# Patient Record
Sex: Male | Born: 1965 | Race: Black or African American | Hispanic: No | Marital: Single | Smoking: Never smoker
Health system: Southern US, Community
[De-identification: ages and names within clinical notes are randomized; demographics above are authoritative.]

## PROBLEM LIST (undated history)

## (undated) DIAGNOSIS — I10 Essential (primary) hypertension: Secondary | ICD-10-CM

## (undated) DIAGNOSIS — N289 Disorder of kidney and ureter, unspecified: Secondary | ICD-10-CM

## (undated) HISTORY — PX: NO PAST SURGERIES: SHX2092

---

## 2001-07-30 ENCOUNTER — Emergency Department (HOSPITAL_COMMUNITY): Admission: EM | Admit: 2001-07-30 | Discharge: 2001-07-30 | Payer: Self-pay | Admitting: Emergency Medicine

## 2009-09-10 ENCOUNTER — Emergency Department (HOSPITAL_COMMUNITY): Admission: EM | Admit: 2009-09-10 | Discharge: 2009-09-10 | Payer: Self-pay | Admitting: Emergency Medicine

## 2010-12-19 LAB — DIFFERENTIAL
Basophils Relative: 1 % (ref 0–1)
Eosinophils Absolute: 0.1 10*3/uL (ref 0.0–0.7)
Eosinophils Relative: 2 % (ref 0–5)
Lymphs Abs: 1.9 10*3/uL (ref 0.7–4.0)
Monocytes Relative: 13 % — ABNORMAL HIGH (ref 3–12)

## 2010-12-19 LAB — CBC
MCHC: 34 g/dL (ref 30.0–36.0)
RBC: 4.96 MIL/uL (ref 4.22–5.81)
WBC: 4.1 10*3/uL (ref 4.0–10.5)

## 2010-12-19 LAB — COMPREHENSIVE METABOLIC PANEL
ALT: 26 U/L (ref 0–53)
Alkaline Phosphatase: 52 U/L (ref 39–117)
Chloride: 105 mEq/L (ref 96–112)
Glucose, Bld: 115 mg/dL — ABNORMAL HIGH (ref 70–99)
Potassium: 3.5 mEq/L (ref 3.5–5.1)
Sodium: 138 mEq/L (ref 135–145)
Total Bilirubin: 0.9 mg/dL (ref 0.3–1.2)
Total Protein: 6.8 g/dL (ref 6.0–8.3)

## 2010-12-19 LAB — POCT CARDIAC MARKERS

## 2010-12-19 LAB — MAGNESIUM: Magnesium: 2 mg/dL (ref 1.5–2.5)

## 2012-06-13 ENCOUNTER — Ambulatory Visit: Payer: Self-pay

## 2014-05-06 ENCOUNTER — Ambulatory Visit: Payer: Self-pay | Admitting: Nurse Practitioner

## 2015-04-06 ENCOUNTER — Encounter: Payer: Self-pay | Admitting: Urgent Care

## 2015-04-06 ENCOUNTER — Emergency Department: Payer: BLUE CROSS/BLUE SHIELD

## 2015-04-06 ENCOUNTER — Emergency Department
Admission: EM | Admit: 2015-04-06 | Discharge: 2015-04-06 | Disposition: A | Discharge status: Home / Self Care | Payer: BLUE CROSS/BLUE SHIELD | Attending: Emergency Medicine | Admitting: Emergency Medicine

## 2015-04-06 DIAGNOSIS — I10 Essential (primary) hypertension: Secondary | ICD-10-CM | POA: Diagnosis not present

## 2015-04-06 DIAGNOSIS — Y998 Other external cause status: Secondary | ICD-10-CM | POA: Diagnosis not present

## 2015-04-06 DIAGNOSIS — S39012A Strain of muscle, fascia and tendon of lower back, initial encounter: Secondary | ICD-10-CM | POA: Diagnosis not present

## 2015-04-06 DIAGNOSIS — X58XXXA Exposure to other specified factors, initial encounter: Secondary | ICD-10-CM | POA: Insufficient documentation

## 2015-04-06 DIAGNOSIS — Y9389 Activity, other specified: Secondary | ICD-10-CM | POA: Diagnosis not present

## 2015-04-06 DIAGNOSIS — Y9289 Other specified places as the place of occurrence of the external cause: Secondary | ICD-10-CM | POA: Insufficient documentation

## 2015-04-06 DIAGNOSIS — S3992XA Unspecified injury of lower back, initial encounter: Secondary | ICD-10-CM | POA: Diagnosis present

## 2015-04-06 HISTORY — DX: Essential (primary) hypertension: I10

## 2015-04-06 LAB — URINALYSIS COMPLETE WITH MICROSCOPIC (ARMC ONLY)
Bacteria, UA: NONE SEEN
Bilirubin Urine: NEGATIVE
GLUCOSE, UA: NEGATIVE mg/dL
HGB URINE DIPSTICK: NEGATIVE
KETONES UR: NEGATIVE mg/dL
Leukocytes, UA: NEGATIVE
NITRITE: NEGATIVE
PROTEIN: NEGATIVE mg/dL
RBC / HPF: NONE SEEN RBC/hpf (ref 0–5)
Specific Gravity, Urine: 1.024 (ref 1.005–1.030)
Squamous Epithelial / LPF: NONE SEEN
pH: 5 (ref 5.0–8.0)

## 2015-04-06 MED ORDER — KETOROLAC TROMETHAMINE 30 MG/ML IJ SOLN
30.0000 mg | Freq: Once | INTRAMUSCULAR | Status: AC
  Administered 2015-04-06: 30 mg via INTRAMUSCULAR
  Filled 2015-04-06: qty 1

## 2015-04-06 MED ORDER — CYCLOBENZAPRINE HCL 10 MG PO TABS: 10.0000 mg | ORAL_TABLET | Freq: Three times a day (TID) | ORAL | Status: AC | PRN

## 2015-04-06 MED ORDER — IBUPROFEN 800 MG PO TABS: 800.0000 mg | ORAL_TABLET | Freq: Three times a day (TID) | ORAL | Status: DC | PRN

## 2015-04-06 NOTE — ED Provider Notes (Signed)
CSN: [id number]     Arrival date & time 04/06/15  9371 History   None    Chief Complaint  Patient presents with  . Back Pain    HPI Comments: 49 year old male presents today complaining of left-sided back pain for the past 4-5 days. He reports that he does a lot of heavy lifting at work but he does not remember a specific injury. He denies pain down his legs, tingling or numbness down his legs, dysuria, hematuria, nausea and vomiting. He has a remote history of kidney stones. He has taken Motrin with moderate relief.  Patient is a 49 y.o. male presenting with back pain. The history is provided by the patient.  Back Pain Location:  Lumbar spine Quality:  Aching Radiates to:  Does not radiate Pain severity:  Moderate Pain is:  Same all the time Onset quality:  Gradual Duration:  5 days Timing:  Constant Progression:  Unchanged Context: lifting heavy objects and occupational injury   Context: not recent illness and not recent injury   Relieved by:  NSAIDs Worsened by:  Movement and ambulation Associated symptoms: no bladder incontinence, no bowel incontinence, no dysuria, no leg pain, no numbness, no paresthesias, no perianal numbness, no tingling and no weakness   Risk factors: obesity     Past Medical History  Diagnosis Date  . Hypertension    History reviewed. No pertinent past surgical history. No family history on file. History  Substance Use Topics  . Smoking status: Never Smoker   . Smokeless tobacco: Not on file  . Alcohol Use: No    Review of Systems  Gastrointestinal: Negative for bowel incontinence.  Genitourinary: Negative for bladder incontinence, dysuria, hematuria and flank pain.  Musculoskeletal: Positive for myalgias, back pain and arthralgias.  Neurological: Negative for tingling, weakness, numbness and paresthesias.  All other systems reviewed and are negative.     Allergies  Review of patient's allergies indicates no known allergies.  Home  Medications   Prior to Admission medications   Medication Sig Start Date End Date Taking? Authorizing Provider  cyclobenzaprine (FLEXERIL) 10 MG tablet Take 1 tablet (10 mg total) by mouth every 8 (eight) hours as needed for muscle spasms. 04/06/15 04/05/16  Luvenia Redden, PA-C  ibuprofen (ADVIL,MOTRIN) 800 MG tablet Take 1 tablet (800 mg total) by mouth every 8 (eight) hours as needed. 04/06/15   Wilber Oliphant V, PA-C   BP 141/67 mmHg  Pulse 54  Temp(Src) 98.6 F (37 C) (Oral)  Resp 18  Ht  (1.753 m)  Wt 240 lb (108.863 kg)  BMI 35.43 kg/m2  SpO2 97% Physical Exam  Constitutional: He is oriented to person, place, and time. Vital signs are normal. He appears well-developed and well-nourished. He is active.  Non-toxic appearance. He does not have a sickly appearance. He does not appear ill.  Obese  HENT:  Head: Normocephalic and atraumatic.  Cardiovascular: Normal rate, regular rhythm, normal heart sounds and intact distal pulses.  Exam reveals no gallop and no friction rub.   No murmur heard. Pulses:      Posterior tibial pulses are 2+ on the right side, and 2+ on the left side.  Pulmonary/Chest: Effort normal and breath sounds normal. No respiratory distress. He has no wheezes. He has no rales.  Abdominal: Soft. Bowel sounds are normal. He exhibits no distension. There is no tenderness. There is no rebound and no guarding.  No CVA tenderness  Musculoskeletal: Normal range of motion.  Right hip: Normal. He exhibits normal range of motion.       Left hip: Normal. He exhibits normal range of motion.       Lumbar back: Normal. He exhibits normal range of motion and no bony tenderness.  No tenderness to palpation over lumbar spine Left lumbar paraspinal muscles tender to palpation without spasm  Neurological: He is alert and oriented to person, place, and time. He has normal strength. No sensory deficit. Gait normal.  Equal strength bilateral lower extremities 5 out of 5 Negative  straight leg raise bilaterally Negative Faber bilaterally  Skin: Skin is warm and dry.  Psychiatric: He has a normal mood and affect. His behavior is normal. Judgment and thought content normal.  Nursing note and vitals reviewed.   ED Course  Procedures (including critical care time) Labs Review Labs Reviewed  URINALYSIS COMPLETEWITH MICROSCOPIC ([facility] ONLY) - Abnormal; Notable for the following:    Color, Urine YELLOW (*)    APPearance CLEAR (*)    All other components within normal limits    Imaging Review No results found.   EKG Interpretation None     I independently reviewed lumbar spine XRAY and there is no evidence of fracture. There is minimal degenerative change at L5/S1 MDM  I reviewed his urine and there is no hematuria. I do not suspect kidney stone based on patient's presentation. Toradol 30 g IM in ER check lumbar spine x-ray Exam consistent with muscular pain- no indication for further work up in ER. Flexeril/Motrin, follow up with PCP Final diagnoses:  Strain of lumbar paraspinal muscle, initial encounter      Luvenia ReddenEmma Weavil V, PA-C 04/06/15 [id number]  Sharyn CreamerMark Quale, MD 04/06/15 1536

## 2015-04-06 NOTE — ED Notes (Signed)
Patient presents with c/o LEFT lower back pain with some (+) radiation into LEFT flank area x 4 days. Denies N/V/D/F and urinary symptoms. No known injuries reported.

## 2015-04-06 NOTE — ED Notes (Signed)
Increasing left flank / back pain, x4-5 day no injury, denies any urinary symptoms, no discoloration of urine. Hx of kidney stones "years ago" with same symptoms

## 2015-04-06 NOTE — Discharge Instructions (Signed)

## 2015-10-06 ENCOUNTER — Emergency Department: Payer: BLUE CROSS/BLUE SHIELD

## 2015-10-06 ENCOUNTER — Emergency Department
Admission: EM | Admit: 2015-10-06 | Discharge: 2015-10-07 | Disposition: A | Discharge status: Home / Self Care | Payer: BLUE CROSS/BLUE SHIELD | Attending: Emergency Medicine | Admitting: Emergency Medicine

## 2015-10-06 ENCOUNTER — Encounter: Payer: Self-pay | Admitting: *Deleted

## 2015-10-06 DIAGNOSIS — R200 Anesthesia of skin: Secondary | ICD-10-CM | POA: Diagnosis not present

## 2015-10-06 DIAGNOSIS — R519 Headache, unspecified: Secondary | ICD-10-CM

## 2015-10-06 DIAGNOSIS — R51 Headache: Secondary | ICD-10-CM

## 2015-10-06 DIAGNOSIS — I10 Essential (primary) hypertension: Secondary | ICD-10-CM | POA: Diagnosis not present

## 2015-10-06 LAB — BASIC METABOLIC PANEL
Anion gap: 4 — ABNORMAL LOW (ref 5–15)
BUN: 20 mg/dL (ref 6–20)
CO2: 28 mmol/L (ref 22–32)
CREATININE: 1.39 mg/dL — AB (ref 0.61–1.24)
Calcium: 9.1 mg/dL (ref 8.9–10.3)
Chloride: 104 mmol/L (ref 101–111)
GFR, EST NON AFRICAN AMERICAN: 58 mL/min — AB (ref >60)
Glucose, Bld: 103 mg/dL — ABNORMAL HIGH (ref 65–99)
POTASSIUM: 3.6 mmol/L (ref 3.5–5.1)
SODIUM: 136 mmol/L (ref 135–145)

## 2015-10-06 LAB — CBC
HEMATOCRIT: 41.8 % (ref 40.0–52.0)
HEMOGLOBIN: 13.8 g/dL (ref 13.0–18.0)
MCH: 25.7 pg — AB (ref 26.0–34.0)
MCHC: 33 g/dL (ref 32.0–36.0)
MCV: 77.9 fL — AB (ref 80.0–100.0)
Platelets: 184 10*3/uL (ref 150–440)
RBC: 5.37 MIL/uL (ref 4.40–5.90)
RDW: 13.9 % (ref 11.5–14.5)
WBC: 4.8 10*3/uL (ref 3.8–10.6)

## 2015-10-06 NOTE — ED Notes (Addendum)
Pt states severe headache that began about 4pm, states numbness to his right side of his head, states hx of HTN but not taking his BP meds regular, denies any dizziness or blurred vision, states his friend check his BP and it was 189/120, denies any chest pain or SOB

## 2015-10-06 NOTE — ED Provider Notes (Signed)
[facility] Emergency Department Provider Note  ____________________________________________  Time seen: 11:40 PM  I have reviewed the triage vital signs and the nursing notes.   HISTORY  Chief Complaint Headache and Hypertension      HPI Jeffery Golden is a 50 y.o. male resents with acute onset of headache at 4 PM accompanied by numbness in the right side of his forehead. Patient had his blood pressure checked by his friend which was 189/120. Patient denies any chest pain no shortness of breath. Patient states headache is since resolved. Of note patient admits to being noncompliant with his lisinopril/HCTZ but did take one today.     Past Medical History  Diagnosis Date  . Hypertension     There are no active problems to display for this patient.   History reviewed. No pertinent past surgical history.  Current Outpatient Rx  Name  Route  Sig  Dispense  Refill  . cyclobenzaprine (FLEXERIL) 10 MG tablet   Oral   Take 1 tablet (10 mg total) by mouth every 8 (eight) hours as needed for muscle spasms.   30 tablet   0   . ibuprofen (ADVIL,MOTRIN) 800 MG tablet   Oral   Take 1 tablet (800 mg total) by mouth every 8 (eight) hours as needed.   30 tablet   0     Allergies Review of patient's allergies indicates no known allergies.  History reviewed. No pertinent family history.  Social History Social History  Substance Use Topics  . Smoking status: Never Smoker   . Smokeless tobacco: None  . Alcohol Use: No    Review of Systems  Constitutional: Negative for fever. Eyes: Negative for visual changes. ENT: Negative for sore throat. Cardiovascular: Negative for chest pain. Respiratory: Negative for shortness of breath. Gastrointestinal: Negative for abdominal pain, vomiting and diarrhea. Genitourinary: Negative for dysuria. Musculoskeletal: Negative for back pain. Skin: Negative for rash. Neurological: Positive for headache and  numbness  10-point ROS otherwise negative.  ____________________________________________   PHYSICAL EXAM:  VITAL SIGNS: ED Triage Vitals  Enc Vitals Group     BP 10/06/15 1834 154/86 mmHg     Pulse Rate 10/06/15 1834 56     Resp 10/06/15 1834 18     Temp 10/06/15 1834 98.3 F (36.8 C)     Temp Source 10/06/15 1834 Oral     SpO2 10/06/15 1834 100 %     Weight 10/06/15 1834 235 lb (106.595 kg)     Height 10/06/15 1834  (1.753 m)     Head Cir --      Peak Flow --      Pain Score 10/06/15 1835 5     Pain Loc --      Pain Edu? --      Excl. in GC? --      Constitutional: Alert and oriented. Well appearing and in no distress. Eyes: Conjunctivae are normal. PERRL. Normal extraocular movements. ENT   Head: Normocephalic and atraumatic.   Nose: No congestion/rhinnorhea.   Mouth/Throat: Mucous membranes are moist.   Neck: No stridor. Hematological/Lymphatic/Immunilogical: No cervical lymphadenopathy. Cardiovascular: Normal rate, regular rhythm. Normal and symmetric distal pulses are present in all extremities. No murmurs, rubs, or gallops. Respiratory: Normal respiratory effort without tachypnea nor retractions. Breath sounds are clear and equal bilaterally. No wheezes/rales/rhonchi. Gastrointestinal: Soft and nontender. No distention. There is no CVA tenderness. Genitourinary: deferred Musculoskeletal: Nontender with normal range of motion in all extremities. No joint effusions.  No  lower extremity tenderness nor edema. Neurologic:  Normal speech and language. No gross focal neurologic deficits are appreciated. Speech is normal.  Skin:  Skin is warm, dry and intact. No rash noted. Psychiatric: Mood and affect are normal. Speech and behavior are normal. Patient exhibits appropriate insight and judgment.  ____________________________________________    LABS (pertinent positives/negatives)  Labs Reviewed  BASIC METABOLIC PANEL - Abnormal; Notable for the  following:    Glucose, Bld 103 (*)    Creatinine, Ser 1.39 (*)    GFR calc non Af Amer 58 (*)    Anion gap 4 (*)    All other components within normal limits  CBC - Abnormal; Notable for the following:    MCV 77.9 (*)    MCH 25.7 (*)    All other components within normal limits         RADIOLOGY  CT Head Wo Contrast (Final result) Result time: 10/07/15 00:21:06   Final result by Rad Results In Interface (10/07/15 00:21:06)   Narrative:   CLINICAL DATA: 50 year old male with hypertension and headache  EXAM: CT HEAD WITHOUT CONTRAST  TECHNIQUE: Contiguous axial images were obtained from the base of the skull through the vertex without intravenous contrast.  COMPARISON: None  FINDINGS: The ventricles and sulci are appropriate in size for patient's age. Minimal periventricular and deep white matter hypodensities represent chronic microvascular ischemic changes. There is no intracranial hemorrhage. No mass effect or midline shift identified.  The visualized paranasal sinuses and mastoid air cells are well aerated. The calvarium is intact.  IMPRESSION: No acute intracranial pathology.   Electronically Signed By: Elgie Collard M.D. On: 10/07/2015 00:21         INITIAL IMPRESSION / ASSESSMENT AND PLAN / ED COURSE  Pertinent labs & imaging results that were available during my care of the patient were reviewed by me and considered in my medical decision making (see chart for details).   Patient's blood pressure 145/81. I discussed the patient at length the importance of taking his blood pressure medication as prescribed with potential adverse sequelae of not doing so. ____________________________________________   FINAL CLINICAL IMPRESSION(S) / ED DIAGNOSES  Final diagnoses:  Essential hypertension  Acute nonintractable headache, unspecified headache type      Darci Current, MD 10/07/15 [phone]

## 2015-10-07 NOTE — Discharge Instructions (Signed)
Hypertension Hypertension, commonly called high blood pressure, is when the force of blood pumping through your arteries is too strong. Your arteries are the blood vessels that carry blood from your heart throughout your body. A blood pressure reading consists of a higher number over a lower number, such as 110/72. The higher number (systolic) is the pressure inside your arteries when your heart pumps. The lower number (diastolic) is the pressure inside your arteries when your heart relaxes. Ideally you want your blood pressure below 120/80. Hypertension forces your heart to work harder to pump blood. Your arteries may become narrow or stiff. Having untreated or uncontrolled hypertension can cause heart attack, stroke, kidney disease, and other problems. RISK FACTORS Some risk factors for high blood pressure are controllable. Others are not.  Risk factors you cannot control include:   Race. You may be at higher risk if you are African American.  Age. Risk increases with age.  Gender. Men are at higher risk than women before age 45 years. After age 65, women are at higher risk than men. Risk factors you can control include:  Not getting enough exercise or physical activity.  Being overweight.  Getting too much fat, sugar, calories, or salt in your diet.  Drinking too much alcohol. SIGNS AND SYMPTOMS Hypertension does not usually cause signs or symptoms. Extremely high blood pressure (hypertensive crisis) may cause headache, anxiety, shortness of breath, and nosebleed. DIAGNOSIS To check if you have hypertension, your health care provider will measure your blood pressure while you are seated, with your arm held at the level of your heart. It should be measured at least twice using the same arm. Certain conditions can cause a difference in blood pressure between your right and left arms. A blood pressure reading that is higher than normal on one occasion does not mean that you need treatment. If  it is not clear whether you have high blood pressure, you may be asked to return on a different day to have your blood pressure checked again. Or, you may be asked to monitor your blood pressure at home for 1 or more weeks. TREATMENT Treating high blood pressure includes making lifestyle changes and possibly taking medicine. Living a healthy lifestyle can help lower high blood pressure. You may need to change some of your habits. Lifestyle changes may include:  Following the DASH diet. This diet is high in fruits, vegetables, and whole grains. It is low in salt, red meat, and added sugars.  Keep your sodium intake below 2,300 mg per day.  Getting at least 30-45 minutes of aerobic exercise at least 4 times per week.  Losing weight if necessary.  Not smoking.  Limiting alcoholic beverages.  Learning ways to reduce stress. Your health care provider may prescribe medicine if lifestyle changes are not enough to get your blood pressure under control, and if one of the following is true:  You are 18-59 years of age and your systolic blood pressure is above 140.  You are 60 years of age or older, and your systolic blood pressure is above 150.  Your diastolic blood pressure is above 90.  You have diabetes, and your systolic blood pressure is over 140 or your diastolic blood pressure is over 90.  You have kidney disease and your blood pressure is above 140/90.  You have heart disease and your blood pressure is above 140/90. Your personal target blood pressure may vary depending on your medical conditions, your age, and other factors. HOME CARE INSTRUCTIONS    Have your blood pressure rechecked as directed by your health care provider.   Take medicines only as directed by your health care provider. Follow the directions carefully. Blood pressure medicines must be taken as prescribed. The medicine does not work as well when you skip doses. Skipping doses also puts you at risk for  problems.  Do not smoke.   Monitor your blood pressure at home as directed by your health care provider. SEEK MEDICAL CARE IF:   You think you are having a reaction to medicines taken.  You have recurrent headaches or feel dizzy.  You have swelling in your ankles.  You have trouble with your vision. SEEK IMMEDIATE MEDICAL CARE IF:  You develop a severe headache or confusion.  You have unusual weakness, numbness, or feel faint.  You have severe chest or abdominal pain.  You vomit repeatedly.  You have trouble breathing. MAKE SURE YOU:   Understand these instructions.  Will watch your condition.  Will get help right away if you are not doing well or get worse.   This information is not intended to replace advice given to you by your health care provider. Make sure you discuss any questions you have with your health care provider.   Document Released: 09/04/2005 Document Revised: 01/19/2015 Document Reviewed: 06/27/2013 Elsevier Interactive Patient Education 2016 [employer]  

## 2016-04-06 ENCOUNTER — Emergency Department (HOSPITAL_COMMUNITY)
Admission: EM | Admit: 2016-04-06 | Discharge: 2016-04-06 | Disposition: A | Discharge status: Home / Self Care | Payer: BLUE CROSS/BLUE SHIELD | Attending: Emergency Medicine | Admitting: Emergency Medicine

## 2016-04-06 ENCOUNTER — Encounter (HOSPITAL_COMMUNITY): Payer: Self-pay | Admitting: Emergency Medicine

## 2016-04-06 DIAGNOSIS — Y929 Unspecified place or not applicable: Secondary | ICD-10-CM | POA: Insufficient documentation

## 2016-04-06 DIAGNOSIS — S39012A Strain of muscle, fascia and tendon of lower back, initial encounter: Secondary | ICD-10-CM | POA: Insufficient documentation

## 2016-04-06 DIAGNOSIS — Y9389 Activity, other specified: Secondary | ICD-10-CM | POA: Insufficient documentation

## 2016-04-06 DIAGNOSIS — S3992XA Unspecified injury of lower back, initial encounter: Secondary | ICD-10-CM | POA: Diagnosis present

## 2016-04-06 DIAGNOSIS — Y990 Civilian activity done for income or pay: Secondary | ICD-10-CM | POA: Diagnosis not present

## 2016-04-06 DIAGNOSIS — I10 Essential (primary) hypertension: Secondary | ICD-10-CM | POA: Insufficient documentation

## 2016-04-06 DIAGNOSIS — X500XXA Overexertion from strenuous movement or load, initial encounter: Secondary | ICD-10-CM | POA: Diagnosis not present

## 2016-04-06 MED ORDER — HYDROCODONE-ACETAMINOPHEN 5-325 MG PO TABS: ORAL_TABLET | ORAL | Status: DC

## 2016-04-06 MED ORDER — IBUPROFEN 800 MG PO TABS
800.0000 mg | ORAL_TABLET | Freq: Three times a day (TID) | ORAL | Status: DC
Start: 2016-04-06 — End: 2017-11-07

## 2016-04-06 MED ORDER — CYCLOBENZAPRINE HCL 10 MG PO TABS: 10.0000 mg | ORAL_TABLET | Freq: Three times a day (TID) | ORAL | Status: DC | PRN

## 2016-04-06 NOTE — ED Notes (Signed)
Pt alert & oriented x4, stable gait. Patient given discharge instructions, paperwork & prescription(s). Patient informed not to drive, operate any equipment & handel any important documents 4 hours after taking pain medication. Patient  instructed to stop at the registration desk to finish any additional paperwork. Patient  verbalized understanding. Pt left department w/ no further questions. 

## 2016-04-06 NOTE — ED Notes (Signed)
Pt states he began having back pain yesterday.  States he does a lot of heavy lifting at work, but denies injury.

## 2016-04-06 NOTE — ED Provider Notes (Signed)
CSN: [id number]     Arrival date & time 04/06/16  1821 History   First MD Initiated Contact with Patient 04/06/16 1828     Chief Complaint  Patient presents with  . Back Pain     (Consider location/radiation/quality/duration/timing/severity/associated sxs/prior Treatment) HPI   Jeffery Golden is a 50 y.o. male who presents to the Emergency Department complaining of gradual onset of left lower back pain that began yesterday after heavy lifting at his job.  He took ibuprofen this morning with minimal relief.  Pain is worse with standing and walking.  He denies fall, abd pain, numbness, weakness or pain to the LE's, urine or bowel changes.   Past Medical History  Diagnosis Date  . Hypertension    History reviewed. No pertinent past surgical history. History reviewed. No pertinent family history. Social History  Substance Use Topics  . Smoking status: Never Smoker   . Smokeless tobacco: None  . Alcohol Use: No    Review of Systems  Constitutional: Negative for fever.  Respiratory: Negative for shortness of breath.   Gastrointestinal: Negative for vomiting, abdominal pain and constipation.  Genitourinary: Negative for dysuria, hematuria, flank pain, decreased urine volume and difficulty urinating.  Musculoskeletal: Positive for back pain. Negative for joint swelling.  Skin: Negative for rash.  Neurological: Negative for weakness and numbness.  All other systems reviewed and are negative.     Allergies  Review of patient's allergies indicates no known allergies.  Home Medications   Prior to Admission medications   Medication Sig Start Date End Date Taking? Authorizing Provider  ibuprofen (ADVIL,MOTRIN) 800 MG tablet Take 1 tablet (800 mg total) by mouth every 8 (eight) hours as needed. 04/06/15   Christella Scheuermann, PA-C   BP 136/75 mmHg  Pulse 50  Temp(Src) 98.1 F (36.7 C) (Oral)  Resp 16  Ht  (1.753 m)  Wt 108.41 kg  BMI 35.28 kg/m2  SpO2 100% Physical Exam   Constitutional: He is oriented to person, place, and time. He appears well-developed and well-nourished. No distress.  HENT:  Head: Normocephalic and atraumatic.  Neck: Normal range of motion. Neck supple.  Cardiovascular: Normal rate, regular rhythm, normal heart sounds and intact distal pulses.   No murmur heard. Pulmonary/Chest: Effort normal and breath sounds normal. No respiratory distress.  Abdominal: Soft. He exhibits no distension. There is no tenderness.  Musculoskeletal: He exhibits tenderness. He exhibits no edema.       Lumbar back: He exhibits tenderness and pain. He exhibits normal range of motion, no swelling, no deformity, no laceration and normal pulse.  ttp of the left lumbar paraspinal muscles.  No spinal tenderness.  DP pulses are brisk and symmetrical.  Distal sensation intact.  Pt has 5/5 strength against resistance of bilateral lower extremities.     Neurological: He is alert and oriented to person, place, and time. He has normal strength. No sensory deficit. He exhibits normal muscle tone. Coordination and gait normal.  Reflex Scores:      Patellar reflexes are 2+ on the right side and 2+ on the left side.      Achilles reflexes are 2+ on the right side and 2+ on the left side. Skin: Skin is warm and dry. No rash noted.  Nursing note and vitals reviewed.   ED Course  Procedures (including critical care time) Labs Review Labs Reviewed - No data to display  Imaging Review No results found. I have personally reviewed and evaluated these images and lab  results as part of my medical decision-making.   EKG Interpretation None      MDM   Final diagnoses:  Lumbar strain, initial encounter    Patient well-appearing. Vital signs are stable. Ambulates with a steady gait. Localized tenderness of the left lower back, likely musculoskeletal. No concerning symptoms for emergent neurological process. Patient agrees symptomatic treatment and PMD follow-up if not  improving.  Narcotic database reviewed, no recent Rx's on file since April.   Pauline Ausammy Necole Minassian, PA-C 04/06/16 1907  Samuel JesterKathleen McManus, DO 04/08/16 2150

## 2016-04-06 NOTE — Discharge Instructions (Signed)

## 2016-06-05 ENCOUNTER — Encounter (HOSPITAL_COMMUNITY): Payer: Self-pay | Admitting: Emergency Medicine

## 2016-06-05 ENCOUNTER — Emergency Department (HOSPITAL_COMMUNITY)
Admission: EM | Admit: 2016-06-05 | Discharge: 2016-06-05 | Disposition: A | Discharge status: Home / Self Care | Payer: BLUE CROSS/BLUE SHIELD | Attending: Emergency Medicine | Admitting: Emergency Medicine

## 2016-06-05 DIAGNOSIS — I10 Essential (primary) hypertension: Secondary | ICD-10-CM | POA: Insufficient documentation

## 2016-06-05 DIAGNOSIS — Z79899 Other long term (current) drug therapy: Secondary | ICD-10-CM | POA: Insufficient documentation

## 2016-06-05 DIAGNOSIS — Y999 Unspecified external cause status: Secondary | ICD-10-CM | POA: Insufficient documentation

## 2016-06-05 DIAGNOSIS — W228XXA Striking against or struck by other objects, initial encounter: Secondary | ICD-10-CM | POA: Insufficient documentation

## 2016-06-05 DIAGNOSIS — Y939 Activity, unspecified: Secondary | ICD-10-CM | POA: Insufficient documentation

## 2016-06-05 DIAGNOSIS — S60455A Superficial foreign body of left ring finger, initial encounter: Secondary | ICD-10-CM | POA: Insufficient documentation

## 2016-06-05 DIAGNOSIS — S6992XA Unspecified injury of left wrist, hand and finger(s), initial encounter: Secondary | ICD-10-CM

## 2016-06-05 DIAGNOSIS — Y929 Unspecified place or not applicable: Secondary | ICD-10-CM | POA: Insufficient documentation

## 2016-06-05 DIAGNOSIS — Z23 Encounter for immunization: Secondary | ICD-10-CM | POA: Insufficient documentation

## 2016-06-05 MED ORDER — TETANUS-DIPHTH-ACELL PERTUSSIS 5-2.5-18.5 LF-MCG/0.5 IM SUSP
0.5000 mL | Freq: Once | INTRAMUSCULAR | Status: AC
  Administered 2016-06-05: 0.5 mL via INTRAMUSCULAR
  Filled 2016-06-05: qty 0.5

## 2016-06-05 MED ORDER — LIDOCAINE HCL (PF) 1 % IJ SOLN
5.0000 mL | Freq: Once | INTRAMUSCULAR | Status: AC
  Administered 2016-06-05: 1 mL via INTRADERMAL
  Filled 2016-06-05: qty 5

## 2016-06-05 MED ORDER — CEPHALEXIN 500 MG PO CAPS: 500.0000 mg | ORAL_CAPSULE | Freq: Three times a day (TID) | ORAL | 0 refills | Status: DC

## 2016-06-05 NOTE — ED Provider Notes (Signed)
[facility]Y DEPT Provider Note   CSN: [id number] Arrival date & time: 06/05/16  1659  By signing my name below, I, Soijett Blue, attest that this documentation has been prepared under the direction and in the presence of Pauline Ausammy Citlalli Weikel, PA-C Electronically Signed: Soijett Blue, ED Scribe. 06/05/16. 5:54 PM.   History   Chief Complaint Chief Complaint  Patient presents with  . Finger Injury    HPI Jeffery Golden is a 50 y.o. male with a PMHx of HTN, who presents to the Emergency Department complaining of left ring finger injury occurring 1 hour ago PTA. Pt notes that he was preparing to go fishing when he reached inside a box when a fishing lure attached to his left ring finger. Pt states that he trimmed the lure and a portion of the fishing hook, although he notes that there is still a portion of the fishing hook inside his left ring finger.  He notes that he has tried removing the fishing hook without medications for the relief of her symptoms. He denies color change, rash, swelling, numbness and any other symptoms.    The history is provided by the patient. No language interpreter was used.    Past Medical History:  Diagnosis Date  . Hypertension     There are no active problems to display for this patient.   History reviewed. No pertinent surgical history.     Home Medications    Prior to Admission medications   Medication Sig Start Date End Date Taking? Authorizing Provider  cyclobenzaprine (FLEXERIL) 10 MG tablet Take 1 tablet (10 mg total) by mouth 3 (three) times daily as needed. 04/06/16   Zoii Florer, PA-C  HYDROcodone-acetaminophen (NORCO/VICODIN) 5-325 MG tablet Take one-two tabs po q 4-6 hrs prn pain 04/06/16   Amy Belloso, PA-C  ibuprofen (ADVIL,MOTRIN) 800 MG tablet Take 1 tablet (800 mg total) by mouth 3 (three) times daily. 04/06/16   Shady Bradish, PA-C    Family History No family history on file.  Social History Social History  Substance  Use Topics  . Smoking status: Never Smoker  . Smokeless tobacco: Never Used  . Alcohol use No     Allergies   Review of patient's allergies indicates no known allergies.   Review of Systems Review of Systems  Musculoskeletal: Negative for arthralgias and joint swelling.  Skin: Positive for wound. Negative for color change and rash.       Fish hook in left ring finger     Physical Exam Updated Vital Signs BP 145/74 (BP Location: Left Arm)   Pulse (!) 58   Temp 98.3 F (36.8 C) (Temporal)   Resp 14   Ht 5\' 8"  (1.727 m)   Wt 240 lb (108.9 kg)   SpO2 100%   BMI 36.49 kg/m   Physical Exam  Constitutional: He is oriented to person, place, and time. He appears well-developed and well-nourished. No distress.  HENT:  Head: Normocephalic and atraumatic.  Eyes: EOM are normal.  Neck: Neck supple.  Cardiovascular: Normal rate.   Pulmonary/Chest: Effort normal. No respiratory distress.  Abdominal: He exhibits no distension.  Musculoskeletal: Normal range of motion.  Fish hook into fourth finger without active bleeding or edema.   Neurological: He is alert and oriented to person, place, and time.  Skin: Skin is warm and dry.  Psychiatric: He has a normal mood and affect. His behavior is normal.  Nursing note and vitals reviewed.    ED Treatments / Results  DIAGNOSTIC STUDIES:  Oxygen Saturation is 100% on RA, nl by my interpretation.    COORDINATION OF CARE: 5:36 PM Discussed treatment plan with pt at bedside which includes foreign body removal and update tetanus and pt agreed to plan.   Procedures .Foreign Body Removal Date/Time: 06/05/2016 5:38 PM Performed by: Pauline Aus Authorized by: Pauline Aus  Consent: Verbal consent obtained. Risks and benefits: risks, benefits and alternatives were discussed Consent given by: patient Patient understanding: patient states understanding of the procedure being performed Patient identity confirmed: verbally with  patient, arm band, provided demographic data and hospital-assigned identification number Time out: Immediately prior to procedure a "time out" was called to verify the correct patient, procedure, equipment, support staff and site/side marked as required. Body area: skin General location: upper extremity Location details: left ring finger Anesthesia: local infiltration  Anesthesia: Local Anesthetic: lidocaine 1% without epinephrine Anesthetic total: 1 mL Patient cooperative: yes Removal mechanism: 18 gauge needle. Dressing: antibiotic ointment and dressing applied Tendon involvement: none Depth: subcutaneous Complexity: complex 1 objects recovered. Objects recovered:  fishing hook Post-procedure assessment: foreign body removed Patient tolerance: Patient tolerated the procedure well with no immediate complications   (including critical care time)  Medications Ordered in ED Medications  lidocaine (PF) (XYLOCAINE) 1 % injection 5 mL (not administered)  Tdap (BOOSTRIX) injection 0.5 mL (not administered)     Initial Impression / Assessment and Plan / ED Course  I have reviewed the triage vital signs and the nursing notes.    Clinical Course    Complete removal of fish hook from ring finger.  Bleeding controlled.  NV intact,  Wound cleaned with saline and betadine.  Wound care instructions given.    Final Clinical Impressions(s) / ED Diagnoses   Final diagnoses:  Fish hook injury of finger, left, initial encounter    New Prescriptions New Prescriptions   No medications on file    I personally performed the services described in this documentation, which was scribed in my presence. The recorded information has been reviewed and is accurate.     Pauline Aus, PA-C 06/08/16 2258    Donnetta Hutching, MD 06/09/16 [phone]

## 2016-06-05 NOTE — Discharge Instructions (Signed)
Clean the area with mild soap and water.  Return for any signs of infection

## 2016-06-05 NOTE — ED Triage Notes (Signed)
Pt has a fish hook stuck in LT ring finger.

## 2016-09-05 ENCOUNTER — Encounter: Payer: Self-pay | Admitting: Physician Assistant

## 2016-09-05 ENCOUNTER — Emergency Department
Admission: EM | Admit: 2016-09-05 | Discharge: 2016-09-05 | Disposition: A | Discharge status: Home / Self Care | Payer: BLUE CROSS/BLUE SHIELD | Attending: Emergency Medicine | Admitting: Emergency Medicine

## 2016-09-05 DIAGNOSIS — I10 Essential (primary) hypertension: Secondary | ICD-10-CM | POA: Insufficient documentation

## 2016-09-05 DIAGNOSIS — R51 Headache: Secondary | ICD-10-CM

## 2016-09-05 DIAGNOSIS — R519 Headache, unspecified: Secondary | ICD-10-CM

## 2016-09-05 MED ORDER — LISINOPRIL 10 MG PO TABS
20.0000 mg | ORAL_TABLET | Freq: Once | ORAL | Status: AC
  Administered 2016-09-05: 20 mg via ORAL
  Filled 2016-09-05: qty 2

## 2016-09-05 MED ORDER — LISINOPRIL-HYDROCHLOROTHIAZIDE 20-12.5 MG PO TABS: 1.0000 | ORAL_TABLET | Freq: Every day | ORAL | 2 refills | Status: DC

## 2016-09-05 NOTE — Discharge Instructions (Signed)
Take the prescription medicine as directed. Monitor your blood pressure regularly. Take Tylenol as needed for headache pain relief.

## 2016-09-05 NOTE — ED Triage Notes (Signed)
Headache and thinks his b/p is elevated  Hands swelling  Has been off b/p meds

## 2016-09-05 NOTE — ED Triage Notes (Signed)
Pt arrives with reports of a HA all day today  Pt states  "i think my headache is r/t my blood pressure and most every morning my hands are swollen

## 2016-09-05 NOTE — ED Notes (Signed)
Patient states he has been having headaches and hand swelling x several months.  STates he has been out of his lisinopril since September.

## 2016-09-06 NOTE — ED Provider Notes (Signed)
[facility] Emergency Department Provider Note ____________________________________________  Time seen: 1856  I have reviewed the triage vital signs and the nursing notes.  HISTORY  Chief Complaint  Headache  HPI Doren Custardimothy L Emery is a 50 y.o. male presents to the ED for evaluation of headache all day today. Patient believes headache is related to his elevated blood pressure because he's been out of his medication for several months now. Patient was previously on Zestoretic 20/12.5 daily for hypertension. He lost his medical benefits after he lost his job. He took his last dose in September and has been out of his medications since then. Has not been able to secure a refill from his previous primary care provider and he has to go. He reports some intermittent hand and feet swelling but denies any visual disturbance, chest pain, shortness of breath. He also denies any change in urination. He presents now for referral request of his blood pressure medicine.He has taken ibuprofen intermittently for headache pain relief.  Past Medical History:  Diagnosis Date  . Hypertension     There are no active problems to display for this patient.   History reviewed. No pertinent surgical history.  Prior to Admission medications   Medication Sig Start Date End Date Taking? Authorizing Provider  cephALEXin (KEFLEX) 500 MG capsule Take 1 capsule (500 mg total) by mouth 3 (three) times daily. For 7 days 06/05/16   Tammy Triplett, PA-C  cyclobenzaprine (FLEXERIL) 10 MG tablet Take 1 tablet (10 mg total) by mouth 3 (three) times daily as needed. 04/06/16   Tammy Triplett, PA-C  HYDROcodone-acetaminophen (NORCO/VICODIN) 5-325 MG tablet Take one-two tabs po q 4-6 hrs prn pain 04/06/16   Tammy Triplett, PA-C  ibuprofen (ADVIL,MOTRIN) 800 MG tablet Take 1 tablet (800 mg total) by mouth 3 (three) times daily. 04/06/16   Tammy Triplett, PA-C  lisinopril-hydrochlorothiazide (ZESTORETIC) 20-12.5  MG tablet Take 1 tablet by mouth daily. 09/05/16 09/05/17  Charlesetta IvoryJenise V Bacon Thedford Bunton, PA-C   Allergies Patient has no known allergies.  No family history on file.  Social History Social History  Substance Use Topics  . Smoking status: Never Smoker  . Smokeless tobacco: Never Used  . Alcohol use No    Review of Systems  Constitutional: Negative for fever. Eyes: Negative for visual changes. ENT: Negative for sore throat. Cardiovascular: Negative for chest pain. Reports swelling of the hands. Respiratory: Negative for shortness of breath. Gastrointestinal: Negative for abdominal pain, vomiting and diarrhea. Genitourinary: Negative for dysuria. Musculoskeletal: Negative for back pain. Skin: Negative for rash. Neurological: Negative for focal weakness or numbness. Reports intermittent headache pain. ____________________________________________  PHYSICAL EXAM:  VITAL SIGNS: ED Triage Vitals  Enc Vitals Group     BP 09/05/16 1809 (!) 166/80     Pulse Rate 09/05/16 1809 (!) 58     Resp 09/05/16 1809 16     Temp 09/05/16 1809 98.3 F (36.8 C)     Temp Source 09/05/16 1809 Oral     SpO2 09/05/16 1809 100 %     Weight 09/05/16 1809 240 lb (108.9 kg)     Height 09/05/16 1809 5\' 9"  (1.753 m)     Head Circumference --      Peak Flow --      Pain Score 09/05/16 1830 10     Pain Loc --      Pain Edu? --      Excl. in GC? --     Constitutional: Alert and oriented. Well appearing and  in no distress. Head: Normocephalic and atraumatic. Eyes: Conjunctivae are normal. PERRL. Normal extraocular movements. Normal fundi bilaterally. Neck: Supple. No thyromegaly. Cardiovascular: Normal rate, regular rhythm. Normal distal pulses. Respiratory: Normal respiratory effort. No wheezes/rales/rhonchi. Gastrointestinal: Soft and nontender. No distention. Musculoskeletal: Nontender with normal range of motion in all extremities.  Neurologic:  Normal gait without ataxia. Normal speech and  language. No gross focal neurologic deficits are appreciated. Skin:  Skin is warm, dry and intact. No rash noted. Psychiatric: Mood and affect are normal. Patient exhibits appropriate insight and judgment. ____________________________________________   LABS (pertinent positives/negatives) Labs Reviewed - No data to display ____________________________________________  PROCEDURES  Lisinopril 20 mg PO ____________________________________________  INITIAL IMPRESSION / ASSESSMENT AND PLAN / ED COURSE  Patient with a history of essential hypertension with poor compliance secondary to loss of medical benefits. His exam is otherwise benign on presentation today. He is discharged with prescription for Zestoretic dose as directed. He is also advised to follow-up with local community clinics or the [place] medical assistance program. He is also advised to dose Tylenol for headache pain relief as needed. He should also follow low-sodium diet in the interim. Return precautions are reviewed.  Clinical Course    ____________________________________________  FINAL CLINICAL IMPRESSION(S) / ED DIAGNOSES  Final diagnoses:  Essential hypertension  Acute nonintractable headache, unspecified headache type      Lissa HoardJenise V Bacon Jadelynn Boylan, PA-C 09/06/16 2341    Sharyn CreamerMark Quale, MD 09/16/16 1550

## 2017-11-07 ENCOUNTER — Other Ambulatory Visit: Payer: Self-pay

## 2017-11-07 ENCOUNTER — Observation Stay
Admission: EM | Admit: 2017-11-07 | Discharge: 2017-11-08 | Disposition: A | Discharge status: Home / Self Care | Payer: BLUE CROSS/BLUE SHIELD | Attending: Internal Medicine | Admitting: Internal Medicine

## 2017-11-07 ENCOUNTER — Emergency Department: Payer: BLUE CROSS/BLUE SHIELD

## 2017-11-07 DIAGNOSIS — I10 Essential (primary) hypertension: Secondary | ICD-10-CM | POA: Diagnosis not present

## 2017-11-07 DIAGNOSIS — R0789 Other chest pain: Secondary | ICD-10-CM | POA: Insufficient documentation

## 2017-11-07 DIAGNOSIS — E785 Hyperlipidemia, unspecified: Secondary | ICD-10-CM | POA: Insufficient documentation

## 2017-11-07 DIAGNOSIS — R079 Chest pain, unspecified: Secondary | ICD-10-CM | POA: Diagnosis present

## 2017-11-07 DIAGNOSIS — Z8249 Family history of ischemic heart disease and other diseases of the circulatory system: Secondary | ICD-10-CM | POA: Insufficient documentation

## 2017-11-07 DIAGNOSIS — R7301 Impaired fasting glucose: Secondary | ICD-10-CM | POA: Insufficient documentation

## 2017-11-07 DIAGNOSIS — R001 Bradycardia, unspecified: Secondary | ICD-10-CM | POA: Diagnosis not present

## 2017-11-07 LAB — BASIC METABOLIC PANEL
Anion gap: 8 (ref 5–15)
BUN: 18 mg/dL (ref 6–20)
CALCIUM: 9.1 mg/dL (ref 8.9–10.3)
CO2: 25 mmol/L (ref 22–32)
CREATININE: 1.14 mg/dL (ref 0.61–1.24)
Chloride: 104 mmol/L (ref 101–111)
GFR calc non Af Amer: >60 mL/min (ref >60)
Glucose, Bld: 119 mg/dL — ABNORMAL HIGH (ref 65–99)
Potassium: 3.7 mmol/L (ref 3.5–5.1)
SODIUM: 137 mmol/L (ref 135–145)

## 2017-11-07 LAB — CBC
HCT: 45.4 % (ref 40.0–52.0)
Hemoglobin: 14.9 g/dL (ref 13.0–18.0)
MCH: 26.4 pg (ref 26.0–34.0)
MCHC: 32.7 g/dL (ref 32.0–36.0)
MCV: 80.5 fL (ref 80.0–100.0)
PLATELETS: 193 10*3/uL (ref 150–440)
RBC: 5.64 MIL/uL (ref 4.40–5.90)
RDW: 14.5 % (ref 11.5–14.5)
WBC: 4.8 10*3/uL (ref 3.8–10.6)

## 2017-11-07 LAB — TROPONIN I
Troponin I: <0.03 ng/mL (ref <0.03)
Troponin I: <0.03 ng/mL (ref <0.03)

## 2017-11-07 MED ORDER — ACETAMINOPHEN 325 MG PO TABS
650.0000 mg | ORAL_TABLET | Freq: Four times a day (QID) | ORAL | Status: DC | PRN
  Administered 2017-11-08: 650 mg via ORAL
  Filled 2017-11-07: qty 2

## 2017-11-07 MED ORDER — LISINOPRIL 10 MG PO TABS
20.0000 mg | ORAL_TABLET | Freq: Every day | ORAL | Status: DC
  Administered 2017-11-08: 20 mg via ORAL
  Filled 2017-11-07: qty 2

## 2017-11-07 MED ORDER — ASPIRIN EC 81 MG PO TBEC
81.0000 mg | DELAYED_RELEASE_TABLET | Freq: Every day | ORAL | Status: DC
  Administered 2017-11-08: 81 mg via ORAL
  Filled 2017-11-07: qty 1

## 2017-11-07 MED ORDER — ACETAMINOPHEN 650 MG RE SUPP: 650.0000 mg | Freq: Four times a day (QID) | RECTAL | Status: DC | PRN

## 2017-11-07 MED ORDER — ENOXAPARIN SODIUM 40 MG/0.4ML ~~LOC~~ SOLN: 40.0000 mg | SUBCUTANEOUS | Status: DC

## 2017-11-07 MED ORDER — HYDROCHLOROTHIAZIDE 12.5 MG PO CAPS
12.5000 mg | ORAL_CAPSULE | Freq: Every day | ORAL | Status: DC
  Administered 2017-11-08: 12.5 mg via ORAL
  Filled 2017-11-07: qty 1

## 2017-11-07 NOTE — ED Notes (Signed)
ED Provider at bedside. 

## 2017-11-07 NOTE — H&P (Signed)
[facility] [place] [facility]   PATIENT NAME: Jeffery Golden    MR#:  [id number]  DATE OF BIRTH:  [birth year]  DATE OF ADMISSION:  11/07/2017  PRIMARY CARE PHYSICIAN: Erasmo DownerStrader, Lindsey F, NP   REQUESTING/REFERRING PHYSICIAN: Dr. Virgilio Freesaroline Norman  CHIEF COMPLAINT:   Chief Complaint  Patient presents with  . Chest Pain    HISTORY OF PRESENT ILLNESS:  Jeffery Golden  is a 52 y.o. male with a known history of hypertension presents with chest pain.  He states that he just pulled up to work and developed chest pain described as a tightness in his chest severe in nature and he thought he was having a heart attack.  His heart felt like it was beating fast.  He rested a little bit and then it went away and then he started walking and it the pain reoccurred.  When he came to the ER he walked to the bathroom and the pain occurred again.  Described as a tightness.  Lasting a minute at a time 8 out of 10 in intensity.  No radiation.  No associated shortness of breath, nausea or vomiting or diaphoresis.  Patient does have a positive family history with his father having a heart attack around age 52 and sister dying of a heart attack at age 52.  Hospitalist services were contacted for further evaluation.  PAST MEDICAL HISTORY:   Past Medical History:  Diagnosis Date  . Hypertension     PAST SURGICAL HISTORY:   Past Surgical History:  Procedure Laterality Date  . NO PAST SURGERIES      SOCIAL HISTORY:   Social History   Tobacco Use  . Smoking status: Never Smoker  . Smokeless tobacco: Never Used  Substance Use Topics  . Alcohol use: No    FAMILY HISTORY:   Family History  Problem Relation Age of Onset  . Hypertension Mother   . CAD Father   . Prostate cancer Father   . CAD Sister     DRUG ALLERGIES:  No Known Allergies  REVIEW OF SYSTEMS:  CONSTITUTIONAL: No fever, fatigue or weakness.  Positive for weight loss EYES: No blurred or double  vision.  EARS, NOSE, AND THROAT: No tinnitus or ear pain. No sore throat RESPIRATORY: No cough, shortness of breath, wheezing or hemoptysis.  CARDIOVASCULAR: No chest pain, orthopnea, edema.  GASTROINTESTINAL: No nausea, vomiting, diarrhea or abdominal pain. No blood in bowel movements GENITOURINARY: No dysuria, hematuria.  ENDOCRINE: No polyuria, nocturia,  HEMATOLOGY: No anemia, easy bruising or bleeding SKIN: No rash or lesion. MUSCULOSKELETAL: Positive for joint pains in his shoulder and fingers medication reconciliation still undergoing NEUROLOGIC: No tingling, numbness, weakness.  PSYCHIATRY: No anxiety or depression.   MEDICATIONS AT HOME:   Prior to Admission medications   Not on File   Medication reconciliation still undergoing.  Patient told me he takes lisinopril HCT 20/12.5 mg p.o. daily  VITAL SIGNS:  Blood pressure (!) 155/85, pulse 62, temperature 98.2 F (36.8 C), temperature source Oral, resp. rate 16, height 5\' 9"  (1.753 m), weight 108.4 kg (239 lb), SpO2 99 %.  PHYSICAL EXAMINATION:  GENERAL:  52 y.o.-year-old patient lying in the bed with no acute distress.  EYES: Pupils equal, round, reactive to light and accommodation. No scleral icterus. Extraocular muscles intact.  HEENT: Head atraumatic, normocephalic. Oropharynx and nasopharynx clear.  NECK:  Supple, no jugular venous distention. No thyroid enlargement, no tenderness.  LUNGS: Normal breath sounds bilaterally, no wheezing, rales,rhonchi or crepitation.  No use of accessory muscles of respiration.  CARDIOVASCULAR: S1, S2 normal. No murmurs, rubs, or gallops.  ABDOMEN: Soft, nontender, nondistended. Bowel sounds present. No organomegaly or mass.  EXTREMITIES: No pedal edema, cyanosis, or clubbing.  NEUROLOGIC: Cranial nerves II through XII are intact. Muscle strength 5/5 in all extremities. Sensation intact. Gait not checked.  PSYCHIATRIC: The patient is alert and oriented x 3.  SKIN: No rash, lesion, or  ulcer.   LABORATORY PANEL:   CBC Recent Labs  Lab 11/07/17 1407  WBC 4.8  HGB 14.9  HCT 45.4  PLT 193   ------------------------------------------------------------------------------------------------------------------  Chemistries  Recent Labs  Lab 11/07/17 1407  NA 137  K 3.7  CL 104  CO2 25  GLUCOSE 119*  BUN 18  CREATININE 1.14  CALCIUM 9.1   ------------------------------------------------------------------------------------------------------------------  Cardiac Enzymes Recent Labs  Lab 11/07/17 1715  TROPONINI <0.03   ------------------------------------------------------------------------------------------------------------------  RADIOLOGY:  Dg Chest 2 View  Result Date: 11/07/2017 CLINICAL DATA:  Chest pain. EXAM: CHEST  2 VIEW COMPARISON:  Radiographs of September 10, 2009. FINDINGS: The heart size and mediastinal contours are within normal limits. Both lungs are clear. No pneumothorax or pleural effusion is noted. The visualized skeletal structures are unremarkable. IMPRESSION: No active cardiopulmonary disease. Electronically Signed   By: Lupita Raider, M.D.   On: 11/07/2017 14:44    EKG:   Sinus bradycardia 54 bpm, flattening T waves inferiorly.  EKG reviewed and interpreted by me  IMPRESSION AND PLAN:   1.  Nonspecific chest pain with strong family history of heart disease.  Get a third troponin and if negative will get a stress test tomorrow.  Monitor on telemetry.  Given aspirin.  No beta-blocker with bradycardia. 2.  Essential hypertension continue lisinopril HCT 3.  Impaired fasting glucose.  Add on a hemoglobin A1c 4.  Laboratory data reviewed by me.  Chest x-ray reviewed by me and is negative.  EKG also interpreted and reviewed by me.    All the records are reviewed and case discussed with ED provider. Management plans discussed with the patient, family and they are in agreement.  CODE STATUS: Full code  TOTAL TIME TAKING CARE OF  THIS PATIENT: .    Alford Highland M.D on 11/07/2017 at 6:49 PM  Between 7am to 6pm - Pager - [phone]  After 6pm call admission pager [phone]  [facility] Office  [phone]  CC: Primary care physician; Erasmo Downer, NP

## 2017-11-07 NOTE — ED Triage Notes (Signed)
Pt presents to ED with co central CP that began about 1 hr PTA at work. States pain when he stands up. Denies N&V&D. Denies SOB. Non-radiating. Alert, oriented, ambulatory. Hx HTN.

## 2017-11-07 NOTE — ED Provider Notes (Signed)
[facility] Emergency Department Provider Note  ____________________________________________  Time seen: Approximately 5:16 PM  I have reviewed the triage vital signs and the nursing notes.   HISTORY  Chief Complaint Chest Pain    HPI Jeffery Golden is a 52 y.o. male a history of hypertension and strong family history of CAD presenting with chest pain.  The patient reports that he was at work not yet exerting himself when he developed a central and left-sided chest "tightness" which lasted for approximately 5 minutes and resolve spontaneously.  Since then, he has had multiple additional episodes that were related to exertion or walking, which resolved with rest.  The patient denies smoking, cocaine.  His last stress test was at least 3 or 4 years ago.  Sh: has worked for 20 years and a [employer].  The patient denies tobacco, cocaine.  FH: Sister died of acute MI in her 8340s, father with MI in his 5750s.  Past Medical History:  Diagnosis Date  . Hypertension     There are no active problems to display for this patient.   History reviewed. No pertinent surgical history.  Current Outpatient Rx  . Order #: [id number] Class: Print  . Order #: [id number] Class: Print  . Order #: [id number] Class: Print  . Order #: [id number] Class: Print  . Order #: [id number] Class: Print    Allergies Patient has no known allergies.  History reviewed. No pertinent family history.  Social History Social History   Tobacco Use  . Smoking status: Never Smoker  . Smokeless tobacco: Never Used  Substance Use Topics  . Alcohol use: No  . Drug use: Not on file    Review of Systems Constitutional: No fever/chills.  No lightheadedness or syncope.  No diaphoresis. Eyes: No visual changes. ENT: No sore throat. No congestion or rhinorrhea. Cardiovascular: Positive central and left-sided sharp chest pain. Denies palpitations. Respiratory: Denies shortness of breath.  No  cough. Gastrointestinal: No abdominal pain.  No nausea, no vomiting.  No diarrhea.  No constipation. Genitourinary: Negative for dysuria. Musculoskeletal: Negative for back pain.  No lower extremity swelling or calf pain. Skin: Negative for rash. Neurological: Negative for headaches. No focal numbness, tingling or weakness.     ____________________________________________   PHYSICAL EXAM:  VITAL SIGNS: ED Triage Vitals [11/07/17 1408]  Enc Vitals Group     BP (!) 170/100     Pulse Rate (!) 58     Resp 16     Temp 98.2 F (36.8 C)     Temp Source Oral     SpO2 96 %     Weight 239 lb (108.4 kg)     Height 5\' 9"  (1.753 m)     Head Circumference      Peak Flow      Pain Score 3     Pain Loc      Pain Edu?      Excl. in GC?     Constitutional: Alert and oriented. Well appearing and in no acute distress. Answers questions appropriately. Eyes: Conjunctivae are normal.  EOMI. No scleral icterus. Head: Atraumatic. Nose: No congestion/rhinnorhea. Mouth/Throat: Mucous membranes are moist.  Neck: No stridor.  Supple.  No JVD.  No meningismus. Cardiovascular: Normal rate, regular rhythm. No murmurs, rubs or gallops.  Respiratory: Normal respiratory effort.  No accessory muscle use or retractions. Lungs CTAB.  No wheezes, rales or ronchi. Gastrointestinal: Soft, nontender and nondistended.  No guarding or rebound.  No peritoneal signs. Musculoskeletal: No LE edema.  No ttp in the calves or palpable cords.  Negative Homan's sign. Neurologic:  A&Ox3.  Speech is clear.  Face and smile are symmetric.  EOMI.  Moves all extremities well. Skin:  Skin is warm, dry and intact. No rash noted. Psychiatric: Mood and affect are normal. Speech and behavior are normal.  Normal judgement.  ____________________________________________   LABS (all labs ordered are listed, but only abnormal results are displayed)  Labs Reviewed  BASIC METABOLIC PANEL - Abnormal; Notable for the following  components:      Result Value   Glucose, Bld 119 (*)    All other components within normal limits  CBC  TROPONIN I  TROPONIN I   ____________________________________________  EKG  ED ECG REPORT I, Rockne Menghini, the attending physician, personally viewed and interpreted this ECG.   Date: 11/07/2017  EKG Time: 1406  Rate: 54  Rhythm: sinus bradycardia  Axis: normal  Intervals:none  ST&T Change: No STEMI  The patient has sinus bradycardia on his EKG and has had heart rates documented between 50 and 58 in all of his visits in 2017.  However, there are no prior EKGs for comparison.  ____________________________________________  RADIOLOGY  Dg Chest 2 View  Result Date: 11/07/2017 CLINICAL DATA:  Chest pain. EXAM: CHEST  2 VIEW COMPARISON:  Radiographs of September 10, 2009. FINDINGS: The heart size and mediastinal contours are within normal limits. Both lungs are clear. No pneumothorax or pleural effusion is noted. The visualized skeletal structures are unremarkable. IMPRESSION: No active cardiopulmonary disease. Electronically Signed   By: Lupita Raider, M.D.   On: 11/07/2017 14:44    ____________________________________________   PROCEDURES  Procedure(s) performed: None  Procedures  Critical Care performed: No ____________________________________________   INITIAL IMPRESSION / ASSESSMENT AND PLAN / ED COURSE  Pertinent labs & imaging results that were available during my care of the patient were reviewed by me and considered in my medical decision making (see chart for details).  52 y.o. male with a history of hypertension and strong family history of CAD presenting with chest pain that is exertional.  Overall, the patient does have sinus bradycardia, and although we have no previous EKGs for comparison, he has had bradycardia in the 50s at all of his visits in 2017.  There are no ischemic changes on his EKG today.  His first troponin is negative and a second  has been ordered.  However, I am concerned about the patient's exertional component of chest pain with his strong family history and personal risk factor of hypertension, in addition to not having any risk stratification study in at least 3 or 4 years.  At this time, we will treat the patient with an aspirin, heparinization and nitrate therapy is not indicated as patient is not having active chest pain.  Plan admission for continued evaluation and treatment.  ____________________________________________  FINAL CLINICAL IMPRESSION(S) / ED DIAGNOSES  Final diagnoses:  Chest pain, unspecified type  Sinus bradycardia         NEW MEDICATIONS STARTED DURING THIS VISIT:  New Prescriptions   No medications on file      Rockne Menghini, MD 11/07/17 1722

## 2017-11-08 ENCOUNTER — Observation Stay: Payer: BLUE CROSS/BLUE SHIELD

## 2017-11-08 ENCOUNTER — Other Ambulatory Visit: Payer: Self-pay

## 2017-11-08 LAB — NM MYOCAR MULTI W/SPECT W/WALL MOTION / EF
CHL CUP NUCLEAR SRS: 2
CSEPED: 1 min
CSEPEW: 1 METS
Exercise duration (sec): 34 s
LV sys vol: 42 mL
LVDIAVOL: 86 mL (ref 62–150)
Peak HR: 115 {beats}/min
Rest HR: 58 {beats}/min
SDS: 0
SSS: 0
TID: 0.78

## 2017-11-08 LAB — BASIC METABOLIC PANEL
Anion gap: 8 (ref 5–15)
BUN: 16 mg/dL (ref 6–20)
CALCIUM: 8.8 mg/dL — AB (ref 8.9–10.3)
CO2: 26 mmol/L (ref 22–32)
CREATININE: 1 mg/dL (ref 0.61–1.24)
Chloride: 104 mmol/L (ref 101–111)
GFR calc non Af Amer: >60 mL/min (ref >60)
Glucose, Bld: 100 mg/dL — ABNORMAL HIGH (ref 65–99)
Potassium: 3.9 mmol/L (ref 3.5–5.1)
Sodium: 138 mmol/L (ref 135–145)

## 2017-11-08 LAB — LIPID PANEL
CHOL/HDL RATIO: 4.6 ratio
CHOLESTEROL: 220 mg/dL — AB (ref 0–200)
HDL: 48 mg/dL (ref >40)
LDL Cholesterol: 152 mg/dL — ABNORMAL HIGH (ref 0–99)
Triglycerides: 98 mg/dL (ref <150)
VLDL: 20 mg/dL (ref 0–40)

## 2017-11-08 LAB — CBC
HCT: 43.3 % (ref 40.0–52.0)
Hemoglobin: 14.4 g/dL (ref 13.0–18.0)
MCH: 26.6 pg (ref 26.0–34.0)
MCHC: 33.3 g/dL (ref 32.0–36.0)
MCV: 80 fL (ref 80.0–100.0)
PLATELETS: 181 10*3/uL (ref 150–440)
RBC: 5.41 MIL/uL (ref 4.40–5.90)
RDW: 14.1 % (ref 11.5–14.5)
WBC: 4.6 10*3/uL (ref 3.8–10.6)

## 2017-11-08 LAB — HEMOGLOBIN A1C
Hgb A1c MFr Bld: 6.3 % — ABNORMAL HIGH (ref 4.8–5.6)
Mean Plasma Glucose: 134.11 mg/dL

## 2017-11-08 MED ORDER — ATORVASTATIN CALCIUM 10 MG PO TABS: 40.0000 mg | ORAL_TABLET | Freq: Every day | ORAL | 11 refills | Status: AC

## 2017-11-08 MED ORDER — TECHNETIUM TC 99M TETROFOSMIN IV KIT
30.0000 | PACK | Freq: Once | INTRAVENOUS | Status: AC | PRN
  Administered 2017-11-08: 30.6 via INTRAVENOUS

## 2017-11-08 MED ORDER — REGADENOSON 0.4 MG/5ML IV SOLN
0.4000 mg | Freq: Once | INTRAVENOUS | Status: AC
  Administered 2017-11-08: 0.4 mg via INTRAVENOUS

## 2017-11-08 MED ORDER — TECHNETIUM TC 99M TETROFOSMIN IV KIT
13.1700 | PACK | Freq: Once | INTRAVENOUS | Status: AC | PRN
  Administered 2017-11-08: 13.17 via INTRAVENOUS

## 2017-11-08 NOTE — Progress Notes (Signed)
[facility]         [place], [place]   11/08/2017  Patient: Jeffery Golden   Date of Birth:  12-Feb-1966  Date of admission:  11/07/2017  Date of Discharge  11/08/2017    To Whom it May Concern:   Jeffery Golden  may return to work on 11/09/17.  If you have any questions or concerns, please don't hesitate to call.  Sincerely,   Altamese DillingVaibhavkumar Kimari Lienhard M.D Pager [phone] Office : [phone]   .

## 2017-11-08 NOTE — Discharge Summary (Signed)
Jeffery Golden, is a 52 y.o. male  DOB 1965/10/19  MRN [id number].  Admission date:  11/07/2017  Admitting Physician  Alford Highlandichard Wieting, MD  Discharge Date:  11/08/2017   Primary MD  Jeffery Golden, Lindsey F, NP  Recommendations for primary care physician for things to follow:   Follow with PCP in 1 month   Admission Diagnosis  Sinus bradycardia [R00.1] Chest pain, unspecified type [R07.9]   Discharge Diagnosis  Sinus bradycardia [R00.1] Chest pain, unspecified type [R07.9]   Active Problems:   Chest pain      Past Medical History:  Diagnosis Date  . Hypertension     Past Surgical History:  Procedure Laterality Date  . NO PAST SURGERIES         History of present illness and  Hospital Course:     Kindly see H&P for history of present illness and admission details, please review complete Labs, Consult reports and Test reports for all details in brief  HPI  from the history and physical done on the day of admission  52 year old male patient with history of essential hypertension came in because of left-sided chest pain.  Admitted to telemetry for left-sided chest pain.  Hospital Course   #1. left-sided chest pain: Troponins have been negative for 3 times, patient had a nuclear medicine stress test which showed low risk scan for ischemia.  EF is around 45-50%.  Discussed the results with patient.  Discharge the patient home and he can continue his BP medicines.  Unable to give beta blockers because of bradycardia.  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  2.  Hyperlipidemia, LDL 152, cholesterol 220.  Discussed this with patient, started on Lipitor 40 mg daily, advised the patient to follow with PCP in 1 month for repeat labs.  Advised patient to continue low fat diet, decrease  fried foods.    Discharge Condition: full   Follow UP  Follow-up Information    Jeffery Golden, Lindsey F, NP. Schedule an appointment as soon as possible for a visit in 1 month(s).   Specialty:  Nurse Practitioner Contact information: [address] [place] [address] [phone]             Discharge Instructions  and  Discharge Medications      Allergies as of 11/08/2017   No Known Allergies     Medication List    TAKE these medications   atorvastatin 10 MG tablet Commonly known as:  LIPITOR Take 4 tablets (40 mg total) by mouth daily.   lisinopril-hydrochlorothiazide 20-12.5 MG tablet Commonly known as:  PRINZIDE,ZESTORETIC Take 1 tablet by mouth daily.         Diet and Activity recommendation: See Discharge Instructions above   Consults obtained -none   Major procedures and Radiology Reports - PLEASE review detailed and final reports for all details, in brief -     Dg Chest 2 View  Result Date: 11/07/2017 CLINICAL DATA:  Chest pain. EXAM: CHEST  2 VIEW COMPARISON:  Radiographs of September 10, 2009. FINDINGS: The heart size and mediastinal contours are within normal limits. Both lungs are clear. No pneumothorax or pleural effusion is noted. The visualized skeletal structures are unremarkable. IMPRESSION: No active cardiopulmonary disease. Electronically Signed   By: Lupita RaiderJames  Green Jr, M.D.   On: 11/07/2017 14:44   Nm Myocar Multi W/spect W/wall Motion / Ef  Result Date: 11/08/2017  There was no ST segment deviation noted during stress.  No T wave inversion was noted during stress.  EF = 45-50%  This is a low risk study.  The left ventricular ejection fraction is mildly decreased (45-54%).  Conclusion No ischemia on myoview Borderline reduce LVF EF=45-50% Recommed f/u cardiology    Micro Results     No results found for this or any previous visit (from the past 240 hour(s)).     Today   Subjective:   [name] today has no  headache,no chest abdominal pain,no new weakness tingling or numbness, feels much better wants to go home today.   Objective:   Blood pressure (!) 160/80, pulse (!) 55, temperature 97.7 F (36.5 C), temperature source Oral, resp. rate 18, height 5\' 9"  (1.753 m), weight 108.4 kg (239 lb), SpO2 98 %.  No intake or output data in the 24 hours ending 11/08/17 1330  Exam Awake Alert, Oriented x 3, No new F.N deficits, Normal affect Braden.AT,PERRAL Supple Neck,No JVD, No cervical lymphadenopathy appriciated.  Symmetrical Chest wall movement, Good air movement bilaterally, CTAB RRR,No Gallops,Rubs or new Murmurs, No Parasternal Heave +ve B.Sounds, Abd Soft, Non tender, No organomegaly appriciated, No rebound -guarding or rigidity. No Cyanosis, Clubbing or edema, No new Rash or bruise  Data Review   CBC w Diff:  Lab Results  Component Value Date   WBC 4.6 11/08/2017   HGB 14.4 11/08/2017   HCT 43.3 11/08/2017   PLT 181 11/08/2017   LYMPHOPCT 46 09/10/2009   MONOPCT 13 (H) 09/10/2009   EOSPCT 2 09/10/2009   BASOPCT 1 09/10/2009    CMP:  Lab Results  Component Value Date   NA 138 11/08/2017   K 3.9 11/08/2017   CL 104 11/08/2017   CO2 26 11/08/2017   BUN 16 11/08/2017   CREATININE 1.00 11/08/2017   PROT 6.8 09/10/2009   ALBUMIN 3.7 09/10/2009   BILITOT 0.9 09/10/2009   ALKPHOS 52 09/10/2009   AST 23 09/10/2009   ALT 26 09/10/2009  .   Total Time in preparing paper work, data evaluation and todays exam - 35 minutes  Katha Hamming M.D on 11/08/2017 at 1:30 PM    Note: This dictation was prepared with Dragon dictation along with smaller phrase technology. Any transcriptional errors that result from this process are unintentional.

## 2017-11-08 NOTE — Progress Notes (Signed)
CCMD reports frequent PVCs and a 10-12 beat run of wide QRS. Pt is lying in bed resting comfortably. Pt appears to be sleeping and has no concerns to offer. MD Sheryle Hailiamond made aware. No new orders, will continue to monitor.

## 2017-11-08 NOTE — Progress Notes (Signed)
Admitted for chest pain, negative troponins x3.  Patient scheduled to have nuclear medicine stress test.  If it is normal patient will be discharged home.  Medicines reviewed, labs reviewed, discussed with patient.  He is chest pain-free at this time.

## 2017-11-08 NOTE — Progress Notes (Signed)
Discharge instructions explained to pt and pts spouse / verbalized an understanding/ iv and tele removed/ work note provided/ transported off unit via wheelchair ?

## 2017-11-09 LAB — HIV ANTIBODY (ROUTINE TESTING W REFLEX): HIV SCREEN 4TH GENERATION: NONREACTIVE

## 2018-01-12 ENCOUNTER — Emergency Department
Admission: EM | Admit: 2018-01-12 | Discharge: 2018-01-12 | Disposition: A | Discharge status: Home / Self Care | Payer: BLUE CROSS/BLUE SHIELD | Attending: Emergency Medicine | Admitting: Emergency Medicine

## 2018-01-12 ENCOUNTER — Other Ambulatory Visit: Payer: Self-pay

## 2018-01-12 DIAGNOSIS — R2242 Localized swelling, mass and lump, left lower limb: Secondary | ICD-10-CM | POA: Diagnosis present

## 2018-01-12 DIAGNOSIS — L03116 Cellulitis of left lower limb: Secondary | ICD-10-CM

## 2018-01-12 DIAGNOSIS — I10 Essential (primary) hypertension: Secondary | ICD-10-CM | POA: Diagnosis not present

## 2018-01-12 DIAGNOSIS — Z79899 Other long term (current) drug therapy: Secondary | ICD-10-CM | POA: Insufficient documentation

## 2018-01-12 MED ORDER — CLINDAMYCIN HCL 300 MG PO CAPS: 300.0000 mg | ORAL_CAPSULE | Freq: Three times a day (TID) | ORAL | 0 refills | Status: AC

## 2018-01-12 MED ORDER — TRAMADOL HCL 50 MG PO TABS: 50.0000 mg | ORAL_TABLET | Freq: Four times a day (QID) | ORAL | 0 refills | Status: AC | PRN

## 2018-01-12 MED ORDER — CLINDAMYCIN HCL 150 MG PO CAPS
300.0000 mg | ORAL_CAPSULE | Freq: Once | ORAL | Status: AC
  Administered 2018-01-12: 300 mg via ORAL
  Filled 2018-01-12: qty 2

## 2018-01-12 MED ORDER — KETOROLAC TROMETHAMINE 60 MG/2ML IM SOLN
60.0000 mg | Freq: Once | INTRAMUSCULAR | Status: AC
  Administered 2018-01-12: 60 mg via INTRAMUSCULAR
  Filled 2018-01-12: qty 2

## 2018-01-12 NOTE — ED Notes (Signed)
Pt reports that he thinks something bit him on top of his left foot. Its been hurting him today for a few hours. Foot is swollen and red and painful. Pt is alert and oriented x 4.

## 2018-01-12 NOTE — ED Provider Notes (Signed)
[facility] Emergency Department Provider Note   ____________________________________________   First MD Initiated Contact with Patient 01/12/18 0150     (approximate)  I have reviewed the triage vital signs and the nursing notes.   HISTORY  Chief Complaint Wound Infection    HPI Jeffery Golden is a 52 y.o. male who comes into the hospital today with some redness of the top of his left foot.  The patient states that he was out all day and developed the pain to the top of his foot.  He reports that he thought it could be as his shoe was rubbing against his foot but he states that he is never had this problem before.  The patient took off his shoe and noticed a red spot at the top of his foot that was painful.  The patient did not take anything for pain but was concerned so came into the hospital.  The patient was concerned about a possible insect bite to his foot.   Past Medical History:  Diagnosis Date  . Hypertension     Patient Active Problem List   Diagnosis Date Noted  . Chest pain 11/07/2017    Past Surgical History:  Procedure Laterality Date  . NO PAST SURGERIES      Prior to Admission medications   Medication Sig Start Date End Date Taking? Authorizing Provider  atorvastatin (LIPITOR) 10 MG tablet Take 4 tablets (40 mg total) by mouth daily. 11/08/17 11/08/18  Katha Hamming, MD  clindamycin (CLEOCIN) 300 MG capsule Take 1 capsule (300 mg total) by mouth 3 (three) times daily for 10 days. 01/12/18 01/22/18  Rebecka Apley, MD  lisinopril-hydrochlorothiazide (PRINZIDE,ZESTORETIC) 20-12.5 MG tablet Take 1 tablet by mouth daily.    [provider]  traMADol (ULTRAM) 50 MG tablet Take 1 tablet (50 mg total) by mouth every 6 (six) hours as needed. 01/12/18   Rebecka Apley, MD    Allergies Patient has no known allergies.  Family History  Problem Relation Age of Onset  . Hypertension Mother   . CAD Father   . Prostate  cancer Father   . CAD Sister     Social History Social History   Tobacco Use  . Smoking status: Never Smoker  . Smokeless tobacco: Never Used  Substance Use Topics  . Alcohol use: No  . Drug use: No    Review of Systems  Constitutional: No fever/chills Eyes: No visual changes. ENT: No sore throat. Cardiovascular: Denies chest pain. Respiratory: Denies shortness of breath. Gastrointestinal: No abdominal pain.  Genitourinary: Negative for dysuria. Musculoskeletal: Negative for back pain. Skin: Redness and pain to top of left foot Neurological: Negative for headaches,   ____________________________________________   PHYSICAL EXAM:  VITAL SIGNS: ED Triage Vitals  Enc Vitals Group     BP 01/12/18 0037 (!) 144/75     Pulse Rate 01/12/18 0037 (!) 52     Resp 01/12/18 0037 16     Temp 01/12/18 0037 98 F (36.7 C)     Temp Source 01/12/18 0037 Oral     SpO2 01/12/18 0037 97 %     Weight 01/12/18 0038 240 lb (108.9 kg)     Height 01/12/18 0038  (1.753 m)     Head Circumference --      Peak Flow --      Pain Score 01/12/18 0037 8     Pain Loc --      Pain Edu? --  Excl. in GC? --     Eyes: Conjunctivae are normal. PERRL. EOMI. Head: Atraumatic. Nose: No congestion/rhinnorhea. Mouth/Throat: Mucous membranes are moist.  Oropharynx non-erythematous. Cardiovascular: Normal rate, regular rhythm. Grossly normal heart sounds.  Good peripheral circulation. Respiratory: Normal respiratory effort.  No retractions. Lungs CTAB. Gastrointestinal: Soft and nontender. No distention.  Musculoskeletal: No lower extremity tenderness nor edema. Neurologic:  Normal speech and language.  Skin:  Skin is warm, dry and intact.  Erythema to the top of the left foot with some warmth and mild tenderness to palpation no fluctuance no induration. Psychiatric: Mood and affect are normal.   ____________________________________________   LABS (all labs ordered are listed, but only  abnormal results are displayed)  Labs Reviewed - No data to display ____________________________________________  EKG  none ____________________________________________  RADIOLOGY  ED MD interpretation:  none  Official radiology report(s): No results found.  ____________________________________________   PROCEDURES  Procedure(s) performed: None  Procedures  Critical Care performed: No  ____________________________________________   INITIAL IMPRESSION / ASSESSMENT AND PLAN / ED COURSE  As part of my medical decision making, I reviewed the following data within the electronic MEDICAL RECORD NUMBER Notes from prior ED visits and [place] Controlled Substance Database   This is a 52 year old male who comes into the hospital today with some redness of the top of his foot.  The patient is concerned about insect bite but it appears that he may have some cellulitis.  The patient is in no acute distress.  I did order some clindamycin for the patient as well as a shot of Toradol.  Since the area did develop today I do not feel we need to do any blood work as the patient has not had any secondary systemic signs such as fever chills aches or anything else.  The patient will be discharged home to follow-up with his primary care physician.      ____________________________________________   FINAL CLINICAL IMPRESSION(S) / ED DIAGNOSES  Final diagnoses:  Cellulitis of left lower extremity     ED Discharge Orders        Ordered    traMADol (ULTRAM) 50 MG tablet  Every 6 hours PRN     01/12/18 0243    clindamycin (CLEOCIN) 300 MG capsule  3 times daily     01/12/18 0243       Note:  This document was prepared using Dragon voice recognition software and may include unintentional dictation errors.    Rebecka Apley, MD 01/12/18 [phone]

## 2018-01-12 NOTE — Discharge Instructions (Addendum)
Please follow up with your primary care physician.

## 2018-01-12 NOTE — ED Triage Notes (Signed)
Patient reports noticed area today to top of left foot.  Reports circular round area with exudate.  Concerned might be an insect bite.

## 2019-03-29 ENCOUNTER — Other Ambulatory Visit: Payer: Self-pay

## 2019-03-29 ENCOUNTER — Emergency Department (HOSPITAL_COMMUNITY)
Admission: EM | Admit: 2019-03-29 | Discharge: 2019-03-29 | Disposition: A | Discharge status: Home / Self Care | Payer: BC Managed Care – PPO | Attending: Emergency Medicine | Admitting: Emergency Medicine

## 2019-03-29 ENCOUNTER — Emergency Department (HOSPITAL_COMMUNITY): Payer: BC Managed Care – PPO

## 2019-03-29 ENCOUNTER — Encounter (HOSPITAL_COMMUNITY): Payer: Self-pay | Admitting: Emergency Medicine

## 2019-03-29 DIAGNOSIS — R1012 Left upper quadrant pain: Secondary | ICD-10-CM | POA: Insufficient documentation

## 2019-03-29 DIAGNOSIS — I10 Essential (primary) hypertension: Secondary | ICD-10-CM | POA: Diagnosis not present

## 2019-03-29 DIAGNOSIS — R109 Unspecified abdominal pain: Secondary | ICD-10-CM

## 2019-03-29 HISTORY — DX: Disorder of kidney and ureter, unspecified: N28.9

## 2019-03-29 LAB — URINALYSIS, ROUTINE W REFLEX MICROSCOPIC
Bilirubin Urine: NEGATIVE
Glucose, UA: NEGATIVE mg/dL
Hgb urine dipstick: NEGATIVE
Ketones, ur: NEGATIVE mg/dL
Leukocytes,Ua: NEGATIVE
Nitrite: NEGATIVE
Protein, ur: NEGATIVE mg/dL
Specific Gravity, Urine: 1.026 (ref 1.005–1.030)
pH: 6 (ref 5.0–8.0)

## 2019-03-29 LAB — BASIC METABOLIC PANEL
Anion gap: 5 (ref 5–15)
BUN: 19 mg/dL (ref 6–20)
CO2: 27 mmol/L (ref 22–32)
Calcium: 8.7 mg/dL — ABNORMAL LOW (ref 8.9–10.3)
Chloride: 104 mmol/L (ref 98–111)
Creatinine, Ser: 1.1 mg/dL (ref 0.61–1.24)
GFR calc Af Amer: >60 mL/min (ref >60)
GFR calc non Af Amer: >60 mL/min (ref >60)
Glucose, Bld: 124 mg/dL — ABNORMAL HIGH (ref 70–99)
Potassium: 3.9 mmol/L (ref 3.5–5.1)
Sodium: 136 mmol/L (ref 135–145)

## 2019-03-29 LAB — CBC
HCT: 43.2 % (ref 39.0–52.0)
Hemoglobin: 13.9 g/dL (ref 13.0–17.0)
MCH: 26.2 pg (ref 26.0–34.0)
MCHC: 32.2 g/dL (ref 30.0–36.0)
MCV: 81.5 fL (ref 80.0–100.0)
Platelets: 179 10*3/uL (ref 150–400)
RBC: 5.3 MIL/uL (ref 4.22–5.81)
RDW: 13.5 % (ref 11.5–15.5)
WBC: 4.3 10*3/uL (ref 4.0–10.5)
nRBC: 0 % (ref 0.0–0.2)

## 2019-03-29 MED ORDER — NAPROXEN 500 MG PO TABS: 500.0000 mg | ORAL_TABLET | Freq: Two times a day (BID) | ORAL | 0 refills | Status: AC

## 2019-03-29 NOTE — ED Notes (Addendum)
Signature pad would NOT work in room. Pt verbalized understanding

## 2019-03-29 NOTE — ED Notes (Signed)
Pt to CT

## 2019-03-29 NOTE — Discharge Instructions (Signed)
Work note provided.  Trial of Naprosyn for 7 days.  Take as directed.  CT scan of abdomen showed no evidence of any kidney stone or any other abnormality inside the abdomen.  Make an appointment to follow-up with your regular doctor.

## 2019-03-29 NOTE — ED Provider Notes (Signed)
[facility] Provider Note   CSN: [id number] Arrival date & time: 03/29/19  0174     History   Chief Complaint Chief Complaint  Patient presents with   Flank Pain    HPI HARSHIL CAVALLARO is a 53 y.o. male.     Patient with complaint of left flank pain for 2 days.  Had a history of kidney stones in the past somewhat similar.  Associated with some mild nausea but no vomiting no hematuria.  It has been constant.  Not made worse by movement.  No history of any injury.  No fever no upper respiratory symptoms.     Past Medical History:  Diagnosis Date   Hypertension    Renal disorder    kidney stone    Patient Active Problem List   Diagnosis Date Noted   Chest pain 11/07/2017    Past Surgical History:  Procedure Laterality Date   NO PAST SURGERIES          Home Medications    Prior to Admission medications   Medication Sig Start Date End Date Taking? Authorizing Provider  lisinopril-hydrochlorothiazide (PRINZIDE,ZESTORETIC) 20-12.5 MG tablet Take 1 tablet by mouth daily.   Yes [provider]  atorvastatin (LIPITOR) 10 MG tablet Take 4 tablets (40 mg total) by mouth daily. 11/08/17 11/08/18  Epifanio Lesches, MD  naproxen (NAPROSYN) 500 MG tablet Take 1 tablet (500 mg total) by mouth 2 (two) times daily. 03/29/19   Fredia Sorrow, MD  traMADol (ULTRAM) 50 MG tablet Take 1 tablet (50 mg total) by mouth every 6 (six) hours as needed. Patient not taking: Reported on 03/29/2019 01/12/18   Loney Hering, MD    Family History Family History  Problem Relation Age of Onset   Hypertension Mother    CAD Father    Prostate cancer Father    CAD Sister     Social History Social History   Tobacco Use   Smoking status: Never Smoker   Smokeless tobacco: Never Used  Substance Use Topics   Alcohol use: No   Drug use: No     Allergies   Patient has no known allergies.   Review of Systems Review of Systems    Constitutional: Negative for chills and fever.  HENT: Negative for congestion, rhinorrhea and sore throat.   Eyes: Negative for visual disturbance.  Respiratory: Negative for cough and shortness of breath.   Cardiovascular: Negative for chest pain and leg swelling.  Gastrointestinal: Negative for abdominal pain, diarrhea, nausea and vomiting.  Genitourinary: Positive for flank pain. Negative for dysuria.  Musculoskeletal: Negative for back pain and neck pain.  Skin: Negative for rash.  Neurological: Negative for dizziness, light-headedness and headaches.  Hematological: Does not bruise/bleed easily.  Psychiatric/Behavioral: Negative for confusion.     Physical Exam Updated Vital Signs BP (!) 168/92 (BP Location: Left Arm)    Pulse (!) 58    Temp 98.1 F (36.7 C) (Oral)    Resp 18    Ht 1.753 m (5\' 9" )    Wt 108.9 kg    SpO2 100%    BMI 35.44 kg/m   Physical Exam Vitals signs and nursing note reviewed.  Constitutional:      General: He is not in acute distress.    Appearance: Normal appearance. He is well-developed.  HENT:     Head: Normocephalic and atraumatic.  Eyes:     Extraocular Movements: Extraocular movements intact.     Conjunctiva/sclera: Conjunctivae normal.  Neck:  Musculoskeletal: Normal range of motion and neck supple.  Cardiovascular:     Rate and Rhythm: Normal rate and regular rhythm.     Heart sounds: No murmur.  Pulmonary:     Effort: Pulmonary effort is normal. No respiratory distress.     Breath sounds: Normal breath sounds.  Abdominal:     Palpations: Abdomen is soft.     Tenderness: There is no abdominal tenderness.  Skin:    General: Skin is warm and dry.  Neurological:     General: No focal deficit present.     Mental Status: He is alert and oriented to person, place, and time.      ED Treatments / Results  Labs (all labs ordered are listed, but only abnormal results are displayed) Labs Reviewed  BASIC METABOLIC PANEL - Abnormal;  Notable for the following components:      Result Value   Glucose, Bld 124 (*)    Calcium 8.7 (*)    All other components within normal limits  CBC  URINALYSIS, ROUTINE W REFLEX MICROSCOPIC    EKG None  Radiology Ct Renal Stone Study  Result Date: 03/29/2019 CLINICAL DATA:  Left flank pain with nausea EXAM: CT ABDOMEN AND PELVIS WITHOUT CONTRAST TECHNIQUE: Multidetector CT imaging of the abdomen and pelvis was performed following the standard protocol without oral or IV contrast. COMPARISON:  None. FINDINGS: Lower chest: Lung bases are clear. Hepatobiliary: There are multiple scattered cysts throughout the liver, ranging in size from as small as 6 mm to as large as 2.8 x 2.5 cm. No noncystic liver lesions are evident on this noncontrast enhanced study. Gallbladder wall is not appreciably thickened. There is no biliary duct dilatation. Pancreas: There is no pancreatic mass or inflammatory focus. Spleen: No splenic lesions are evident. Adrenals/Urinary Tract: Adrenals appear normal bilaterally. Kidneys bilaterally show no evident mass or hydronephrosis on either side. There is no evident renal or ureteral calculus on either side. Urinary bladder is midline with wall thickness within normal limits. Stomach/Bowel: There are scattered sigmoid diverticula without diverticulitis. There is no appreciable bowel wall or mesenteric thickening. There is no bowel obstruction. Terminal ileum appears unremarkable. There is no evident free air or portal venous air. Vascular/Lymphatic: There are scattered foci of calcification in the common iliac arteries bilaterally. There is no abdominal aortic aneurysm. There is no evident adenopathy in the abdomen or pelvis. There are subcentimeter inguinal lymph nodes which are considered nonspecific. Reproductive: Prostate and seminal vesicles are normal in size and contour. There is no evident pelvic mass. Other: Appendix appears normal. There is no abscess or ascites in the  abdomen or pelvis. There is moderate fat in the right inguinal ring. Musculoskeletal: There is degenerative change at L5-S1 with vacuum phenomenon at this level. There are no blastic or lytic bone lesions. There is no intramuscular or abdominal wall lesion. IMPRESSION: 1. No evident renal or ureteral calculus. No hydronephrosis. Urinary bladder wall thickness is within normal limits. 2. Occasional sigmoid diverticula without diverticulitis. No bowel obstruction. No abscess in the abdomen or pelvis. Appendix appears normal. 3.  Iliac artery atherosclerotic calcification bilaterally. Comment: A cause for patient's symptoms has not been established with this study. Electronically Signed   By: Bretta BangWilliam  Woodruff III M.D.   On: 03/29/2019 08:22    Procedures Procedures (including critical care time)  Medications Ordered in ED Medications - No data to display   Initial Impression / Assessment and Plan / ED Course  I have reviewed the triage  vital signs and the nursing notes.  Pertinent labs & imaging results that were available during my care of the patient were reviewed by me and considered in my medical decision making (see chart for details).        CT scan negative for any acute intra-abdominal findings no evidence of any kidney stones.  Urinalysis normal CBC normal BMP normal.  Normal renal function.  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  Patient does have primary care doctor to follow-up with.  Final Clinical Impressions(s) / ED Diagnoses   Final diagnoses:  Left flank pain    ED Discharge Orders         Ordered    naproxen (NAPROSYN) 500 MG tablet  2 times daily     03/29/19 [id number]           Vanetta MuldersZackowski, Jacquan Savas, MD 03/29/19 [phone]

## 2019-03-29 NOTE — ED Triage Notes (Signed)
Pain to lt flank x 2 days. Feels like pain from kidney stone he has had in the past.

## 2020-01-26 ENCOUNTER — Emergency Department (HOSPITAL_COMMUNITY): Payer: BC Managed Care – PPO

## 2020-01-26 ENCOUNTER — Other Ambulatory Visit: Payer: Self-pay

## 2020-01-26 ENCOUNTER — Encounter (HOSPITAL_COMMUNITY): Payer: Self-pay | Admitting: *Deleted

## 2020-01-26 ENCOUNTER — Emergency Department (HOSPITAL_COMMUNITY)
Admission: EM | Admit: 2020-01-26 | Discharge: 2020-01-26 | Disposition: A | Discharge status: Home / Self Care | Payer: BC Managed Care – PPO | Attending: Emergency Medicine | Admitting: Emergency Medicine

## 2020-01-26 DIAGNOSIS — R079 Chest pain, unspecified: Secondary | ICD-10-CM

## 2020-01-26 DIAGNOSIS — Z79899 Other long term (current) drug therapy: Secondary | ICD-10-CM | POA: Diagnosis not present

## 2020-01-26 DIAGNOSIS — I10 Essential (primary) hypertension: Secondary | ICD-10-CM | POA: Diagnosis not present

## 2020-01-26 DIAGNOSIS — R0789 Other chest pain: Secondary | ICD-10-CM | POA: Insufficient documentation

## 2020-01-26 LAB — BASIC METABOLIC PANEL
Anion gap: 8 (ref 5–15)
BUN: 14 mg/dL (ref 6–20)
CO2: 28 mmol/L (ref 22–32)
Calcium: 9.1 mg/dL (ref 8.9–10.3)
Chloride: 98 mmol/L (ref 98–111)
Creatinine, Ser: 1.1 mg/dL (ref 0.61–1.24)
GFR calc Af Amer: >60 mL/min (ref >60)
GFR calc non Af Amer: >60 mL/min (ref >60)
Glucose, Bld: 126 mg/dL — ABNORMAL HIGH (ref 70–99)
Potassium: 3.7 mmol/L (ref 3.5–5.1)
Sodium: 134 mmol/L — ABNORMAL LOW (ref 135–145)

## 2020-01-26 LAB — CBC
HCT: 42.9 % (ref 39.0–52.0)
Hemoglobin: 13.9 g/dL (ref 13.0–17.0)
MCH: 26.8 pg (ref 26.0–34.0)
MCHC: 32.4 g/dL (ref 30.0–36.0)
MCV: 82.8 fL (ref 80.0–100.0)
Platelets: 202 10*3/uL (ref 150–400)
RBC: 5.18 MIL/uL (ref 4.22–5.81)
RDW: 13.7 % (ref 11.5–15.5)
WBC: 5 10*3/uL (ref 4.0–10.5)
nRBC: 0 % (ref 0.0–0.2)

## 2020-01-26 LAB — TROPONIN I (HIGH SENSITIVITY)
Troponin I (High Sensitivity): 7 ng/L (ref <18)
Troponin I (High Sensitivity): 8 ng/L (ref <18)

## 2020-01-26 MED ORDER — NITROGLYCERIN 0.4 MG SL SUBL
0.4000 mg | SUBLINGUAL_TABLET | SUBLINGUAL | Status: DC | PRN
  Administered 2020-01-26: 16:00:00 0.4 mg via SUBLINGUAL
  Filled 2020-01-26: qty 1

## 2020-01-26 MED ORDER — ASPIRIN 81 MG PO CHEW
324.0000 mg | CHEWABLE_TABLET | Freq: Once | ORAL | Status: AC
  Administered 2020-01-26: 324 mg via ORAL
  Filled 2020-01-26: qty 4

## 2020-01-26 MED ORDER — SODIUM CHLORIDE 0.9% FLUSH: 3.0000 mL | Freq: Once | INTRAVENOUS | Status: DC

## 2020-01-26 NOTE — ED Triage Notes (Signed)
CHEST PAIN

## 2020-01-26 NOTE — Discharge Instructions (Addendum)
You are seen in the emergency department for chest pain.  You had blood work EKG and a chest x-ray that did not show any serious findings.  We are referring you onto cardiology for follow-up.  You should take an aspirin a day.  Return to the emergency department if any worsening or concerning symptoms

## 2020-01-26 NOTE — ED Provider Notes (Signed)
[facility] EMERGENCY DEPARTMENT Provider Note   CSN: [id number] Arrival date & time: 01/26/20  1429     History Chief Complaint  Patient presents with  . Chest Pain    Jeffery Golden is a 54 y.o. male.  He is presenting with substernal chest pain that began about an hour ago at rest.  York Spaniel it was associated with some diaphoresis.  No radiation.  Similar episode a week ago resolved on its own.  Denies smoking or cocaine.  History of hypertension.  He said he was admitted to [facility] a few years ago for chest pain and had a nuclear test and told him he was fine.   Chest Pain Associated symptoms: diaphoresis   Associated symptoms: no abdominal pain, no fever, no headache and no shortness of breath     HPI: A 54 year old patient with a history of hypertension presents for evaluation of chest pain. Initial onset of pain was approximately 1-3 hours ago. The patient's chest pain is described as heaviness/pressure/tightness and is not worse with exertion. The patient reports some diaphoresis. The patient's chest pain is middle- or left-sided, is not well-localized, is not sharp and does not radiate to the arms/jaw/neck. The patient does not complain of nausea. The patient has no history of stroke, has no history of peripheral artery disease, has not smoked in the past 90 days, denies any history of treated diabetes, has no relevant family history of coronary artery disease (first degree relative at less than age 53), has no history of hypercholesterolemia and does not have an elevated BMI (>=30).   Past Medical History:  Diagnosis Date  . Hypertension   . Renal disorder    kidney stone    Patient Active Problem List   Diagnosis Date Noted  . Chest pain 11/07/2017    Past Surgical History:  Procedure Laterality Date  . NO PAST SURGERIES         Family History  Problem Relation Age of Onset  . Hypertension Mother   . CAD Father   . Prostate cancer Father   . CAD Sister      Social History   Tobacco Use  . Smoking status: Never Smoker  . Smokeless tobacco: Never Used  Substance Use Topics  . Alcohol use: No  . Drug use: No    Home Medications Prior to Admission medications   Medication Sig Start Date End Date Taking? Authorizing Provider  atorvastatin (LIPITOR) 10 MG tablet Take 4 tablets (40 mg total) by mouth daily. 11/08/17 11/08/18  Katha Hamming, MD  lisinopril-hydrochlorothiazide (PRINZIDE,ZESTORETIC) 20-12.5 MG tablet Take 1 tablet by mouth daily.    [provider]  naproxen (NAPROSYN) 500 MG tablet Take 1 tablet (500 mg total) by mouth 2 (two) times daily. 03/29/19   Vanetta Mulders, MD  traMADol (ULTRAM) 50 MG tablet Take 1 tablet (50 mg total) by mouth every 6 (six) hours as needed. Patient not taking: Reported on 03/29/2019 01/12/18   Rebecka Apley, MD    Allergies    Patient has no known allergies.  Review of Systems   Review of Systems  Constitutional: Positive for diaphoresis. Negative for fever.  HENT: Negative for sore throat.   Eyes: Negative for visual disturbance.  Respiratory: Negative for shortness of breath.   Cardiovascular: Positive for chest pain.  Gastrointestinal: Negative for abdominal pain.  Genitourinary: Negative for dysuria.  Musculoskeletal: Negative for neck pain.  Skin: Negative for rash.  Neurological: Negative for headaches.    Physical  Exam Updated Vital Signs BP (!) 151/83   Pulse (!) 54   Temp 98.3 F (36.8 C)   Resp 20   Ht 5\' 9"  (1.753 m)   SpO2 100%   BMI 35.44 kg/m   Physical Exam Vitals and nursing note reviewed.  Constitutional:      Appearance: He is well-developed.  HENT:     Head: Normocephalic and atraumatic.  Eyes:     Conjunctiva/sclera: Conjunctivae normal.  Cardiovascular:     Rate and Rhythm: Regular rhythm. Bradycardia present.     Heart sounds: Normal heart sounds. No murmur.  Pulmonary:     Effort: Pulmonary effort is normal. No respiratory  distress.     Breath sounds: Normal breath sounds.  Abdominal:     Palpations: Abdomen is soft.     Tenderness: There is no abdominal tenderness.  Musculoskeletal:        General: Normal range of motion.     Cervical back: Neck supple.     Right lower leg: No tenderness.     Left lower leg: No tenderness.  Skin:    General: Skin is warm and dry.     Capillary Refill: Capillary refill takes less than 2 seconds.  Neurological:     General: No focal deficit present.     Mental Status: He is alert.     ED Results / Procedures / Treatments   Labs (all labs ordered are listed, but only abnormal results are displayed) Labs Reviewed  BASIC METABOLIC PANEL - Abnormal; Notable for the following components:      Result Value   Sodium 134 (*)    Glucose, Bld 126 (*)    All other components within normal limits  CBC  TROPONIN I (HIGH SENSITIVITY)  TROPONIN I (HIGH SENSITIVITY)    EKG EKG Interpretation  Date/Time:  Monday Jan 26 2020 14:45:34 EDT Ventricular Rate:  52 PR Interval:  194 QRS Duration: 88 QT Interval:  422 QTC Calculation: 392 R Axis:   53 Text Interpretation: Sinus bradycardia Nonspecific T wave abnormality Confirmed by [id number] (Cathren Laine) on 01/26/2020 2:48:45 PM   Radiology DG Chest 2 View  Result Date: 01/26/2020 CLINICAL DATA:  Chest pain. EXAM: CHEST - 2 VIEW COMPARISON:  November 07, 2017 FINDINGS: The heart size and mediastinal contours are within normal limits. Both lungs are clear. The visualized skeletal structures are unremarkable. IMPRESSION: No active cardiopulmonary disease. Electronically Signed   By: November 09, 2017 M.D.   On: 01/26/2020 15:18    Procedures Procedures (including critical care time)  Medications Ordered in ED Medications  aspirin chewable tablet 324 mg (324 mg Oral Given 01/26/20 1609)    ED Course  I have reviewed the triage vital signs and the nursing notes.  Pertinent labs & imaging results that were available  during my care of the patient were reviewed by me and considered in my medical decision making (see chart for details).  Clinical Course as of Jan 26 1022  Mon Jan 26, 2020  1635 Chest pain improved from 5-1 with 1 nitro.  Initial troponin VII.   [MB]  1747 Troponin minimally changed from 7-8.  Pain is resolved.  Will review with patient and have discussion regarding outpatient cardiology follow-up.   [MB]    Clinical Course User Index [MB] Jan 28, 2020, MD   MDM Rules/Calculators/A&P HEAR Score: 4                    This patient complains  of chest pain; this involves an extensive number of treatment Options and is a complaint that carries with it a high risk of complications and Morbidity. The differential includes ACS, pneumothorax, pneumonia, vascular, PE, reflux, esophageal spasm, musculoskeletal  I ordered, reviewed and interpreted labs, which included normal CBC, essentially normal chemistry other than elevated glucose of 126, delta troponin unchanged. I ordered medication aspirin and nitroglycerin I ordered imaging studies which included chest x-ray and I independently    visualized and interpreted imaging which showed no acute disease Previous records obtained and reviewed in epic including [facility] admission with nuclear test showing low risk  After the interventions stated above, I reevaluated the patient and found patient's pain to be resolved.  He has been bradycardic on and off throughout his time on the monitor.  Asymptomatic with that.  Has been noted on prior visits.  We will place a referral to cardiology and return instructions discussed.  Final Clinical Impression(s) / ED Diagnoses Final diagnoses:  Nonspecific chest pain    Rx / DC Orders ED Discharge Orders    None       Hayden Rasmussen, MD 01/27/20 1025

## 2020-01-26 NOTE — ED Triage Notes (Signed)
States pain started while talking

## 2020-02-23 NOTE — Progress Notes (Deleted)
CARDIOLOGY CONSULT NOTE       Patient ID: Jeffery Golden MRN: [id number] DOB/AGE: [birth year] 54 y.o.  Admit date: (Not on file) Referring Physician: Iran Ouch Primary Physician: Erasmo Downer, NP Primary Cardiologist: New previously Donalda Ewings Reason for Consultation: Chest Pain  Active Problems:   * No active hospital problems. *   HPI:  54 y.o. referred by Iran Ouch NP for chest pain. CRF;s include HTN and HLD. Previously seen at [facility] 2019 with negative stress test noted OSA and obesity Echo 2019 with EF 55% trivial MR Seen in [facility] ED 01/26/20 with chest pain and diaphoresis. Has similar episode a week before that Heaviness worse with exertion ? Relief with nitro Pain resolved R/O no acute ECG changes CXR ok Noted some bradycardia on telemetry D/c home for outpatient f/u   ***  ROS All other systems reviewed and negative except as noted above  Past Medical History:  Diagnosis Date  . Hypertension   . Renal disorder    kidney stone    Family History  Problem Relation Age of Onset  . Hypertension Mother   . CAD Father   . Prostate cancer Father   . CAD Sister     Social History   Socioeconomic History  . Marital status: Single    Spouse name: Not on file  . Number of children: Not on file  . Years of education: Not on file  . Highest education level: Not on file  Occupational History  . Not on file  Tobacco Use  . Smoking status: Never Smoker  . Smokeless tobacco: Never Used  Substance and Sexual Activity  . Alcohol use: No  . Drug use: No  . Sexual activity: Not on file  Other Topics Concern  . Not on file  Social History Narrative  . Not on file   Social Determinants of Health   Financial Resource Strain:   . Difficulty of Paying Living Expenses:   Food Insecurity:   . Worried About Programme researcher, broadcasting/film/video in the Last Year:   . Barista in the Last Year:   Transportation Needs:   . Freight forwarder (Medical):   Marland Kitchen Lack of  Transportation (Non-Medical):   Physical Activity:   . Days of Exercise per Week:   . Minutes of Exercise per Session:   Stress:   . Feeling of Stress :   Social Connections:   . Frequency of Communication with Friends and Family:   . Frequency of Social Gatherings with Friends and Family:   . Attends Religious Services:   . Active Member of Clubs or Organizations:   . Attends Banker Meetings:   Marland Kitchen Marital Status:   Intimate Partner Violence:   . Fear of Current or Ex-Partner:   . Emotionally Abused:   Marland Kitchen Physically Abused:   . Sexually Abused:     Past Surgical History:  Procedure Laterality Date  . NO PAST SURGERIES        Current Outpatient Medications:  .  atorvastatin (LIPITOR) 10 MG tablet, Take 4 tablets (40 mg total) by mouth daily., Disp: 30 tablet, Rfl: 11 .  lisinopril-hydrochlorothiazide (PRINZIDE,ZESTORETIC) 20-12.5 MG tablet, Take 1 tablet by mouth daily., Disp: , Rfl:  .  naproxen (NAPROSYN) 500 MG tablet, Take 1 tablet (500 mg total) by mouth 2 (two) times daily., Disp: 14 tablet, Rfl: 0 .  traMADol (ULTRAM) 50 MG tablet, Take 1 tablet (50 mg total) by mouth every 6 (six) hours  as needed. (Patient not taking: Reported on 03/29/2019), Disp: 12 tablet, Rfl: 0    Physical Exam: There were no vitals taken for this visit.   Affect appropriate Overweight black male  HEENT: normal Neck supple with no adenopathy JVP normal no bruits no thyromegaly Lungs clear with no wheezing and good diaphragmatic motion Heart:  S1/S2 no murmur, no rub, gallop or click PMI normal Abdomen: benighn, BS positve, no tenderness, no AAA no bruit.  No HSM or HJR Distal pulses intact with no bruits No edema Neuro non-focal Skin warm and dry No muscular weakness  Labs:   Lab Results  Component Value Date   WBC 5.0 01/26/2020   HGB 13.9 01/26/2020   HCT 42.9 01/26/2020   MCV 82.8 01/26/2020   PLT 202 01/26/2020   No results for input(s): NA, K, CL, CO2, BUN,  CREATININE, CALCIUM, PROT, BILITOT, ALKPHOS, ALT, AST, GLUCOSE in the last 168 hours.  Invalid input(s): LABALBU Lab Results  Component Value Date   TROPONINI <0.03 11/07/2017    Lab Results  Component Value Date   CHOL 220 (H) 11/08/2017   Lab Results  Component Value Date   HDL 48 11/08/2017   Lab Results  Component Value Date   LDLCALC 152 (H) 11/08/2017   Lab Results  Component Value Date   TRIG 98 11/08/2017   Lab Results  Component Value Date   CHOLHDL 4.6 11/08/2017   No results found for: LDLDIRECT    Radiology: DG Chest 2 View  Result Date: 01/26/2020 CLINICAL DATA:  Chest pain. EXAM: CHEST - 2 VIEW COMPARISON:  November 07, 2017 FINDINGS: The heart size and mediastinal contours are within normal limits. Both lungs are clear. The visualized skeletal structures are unremarkable. IMPRESSION: No active cardiopulmonary disease. Electronically Signed   By: Virgina Norfolk M.D.   On: 01/26/2020 15:18    EKG: sR rate 52 nonspecific ST changes 01/26/20   ASSESSMENT AND PLAN:   1. Chest Pain: normal nuclear study 2019. CRF;s HTN, HLD and family history *** 2. HtN:  Well controlled.  Continue current medications and low sodium Dash type diet.   3. OSA:  *** 4. Bradycardia:  ***  Signed: Jenkins Rouge 02/23/2020, 8:40 AM

## 2020-03-02 ENCOUNTER — Ambulatory Visit: Payer: BC Managed Care – PPO | Admitting: Cardiovascular Disease

## 2020-07-02 ENCOUNTER — Ambulatory Visit: Payer: BC Managed Care – PPO | Admitting: Internal Medicine

## 2020-08-28 ENCOUNTER — Ambulatory Visit (INDEPENDENT_AMBULATORY_CARE_PROVIDER_SITE_OTHER): Payer: BC Managed Care – PPO

## 2020-08-28 ENCOUNTER — Ambulatory Visit
Admission: EM | Admit: 2020-08-28 | Discharge: 2020-08-28 | Disposition: A | Discharge status: Home / Self Care | Payer: BC Managed Care – PPO | Attending: Internal Medicine | Admitting: Internal Medicine

## 2020-08-28 ENCOUNTER — Other Ambulatory Visit: Payer: Self-pay

## 2020-08-28 DIAGNOSIS — S61239A Puncture wound without foreign body of unspecified finger without damage to nail, initial encounter: Secondary | ICD-10-CM | POA: Diagnosis not present

## 2020-08-28 DIAGNOSIS — Z23 Encounter for immunization: Secondary | ICD-10-CM

## 2020-08-28 DIAGNOSIS — M79642 Pain in left hand: Secondary | ICD-10-CM | POA: Diagnosis not present

## 2020-08-28 MED ORDER — TETANUS-DIPHTH-ACELL PERTUSSIS 5-2.5-18.5 LF-MCG/0.5 IM SUSY
0.5000 mL | PREFILLED_SYRINGE | Freq: Once | INTRAMUSCULAR | Status: AC
  Administered 2020-08-28: 0.5 mL via INTRAMUSCULAR

## 2020-08-28 MED ORDER — BACITRACIN ZINC 500 UNIT/GM EX OINT: 1.0000 "application " | TOPICAL_OINTMENT | Freq: Two times a day (BID) | CUTANEOUS | 0 refills | Status: AC

## 2020-08-28 MED ORDER — IBUPROFEN 600 MG PO TABS: 600.0000 mg | ORAL_TABLET | Freq: Four times a day (QID) | ORAL | 0 refills | Status: AC | PRN

## 2020-08-28 NOTE — ED Triage Notes (Signed)
Pt presents with injury to left index finger from drill, pt states drill bit went into finger

## 2020-08-28 NOTE — Discharge Instructions (Signed)
Daily wound dressing changes with topical antibiotic Gentle range of motion exercises If you notice increased discharge, redness, increased swelling or pain please return to the urgent care to be reevaluated.

## 2020-08-31 NOTE — ED Provider Notes (Signed)
[facility]    CSN: [id number] Arrival date & time: 08/28/20  1529      History   Chief Complaint Chief Complaint  Patient presents with  . Finger Injury    HPI Jeffery Golden is a 54 y.o. male comes to the urgent care with injury to the left index finger.  Patient was using a drill at work today.  While drilling patient says that the drill bit slipped off and rolled into the finger.  Patient has pain which is constant, throbbing and associated with swelling of the proximal phalanx of the left finger.  No duskiness or discoloration of the distal phalanx.  Patient is able to bend finger slightly.  No numbness or tingling.  Last Tdap is more than 10 years ago.   HPI  Past Medical History:  Diagnosis Date  . Hypertension   . Renal disorder    kidney stone    Patient Active Problem List   Diagnosis Date Noted  . Chest pain 11/07/2017    Past Surgical History:  Procedure Laterality Date  . NO PAST SURGERIES         Home Medications    Prior to Admission medications   Medication Sig Start Date End Date Taking? Authorizing Provider  atorvastatin (LIPITOR) 10 MG tablet Take 4 tablets (40 mg total) by mouth daily. 11/08/17 11/08/18  Katha Hamming, MD  bacitracin ointment Apply 1 application topically 2 (two) times daily. 08/28/20   Merrilee Jansky, MD  ibuprofen (ADVIL) 600 MG tablet Take 1 tablet (600 mg total) by mouth every 6 (six) hours as needed. 08/28/20   Merrilee Jansky, MD  lisinopril-hydrochlorothiazide (PRINZIDE,ZESTORETIC) 20-12.5 MG tablet Take 1 tablet by mouth daily.    [provider]  naproxen (NAPROSYN) 500 MG tablet Take 1 tablet (500 mg total) by mouth 2 (two) times daily. 03/29/19   Vanetta Mulders, MD  traMADol (ULTRAM) 50 MG tablet Take 1 tablet (50 mg total) by mouth every 6 (six) hours as needed. Patient not taking: Reported on 03/29/2019 01/12/18   Rebecka Apley, MD    Family History Family History  Problem  Relation Age of Onset  . Hypertension Mother   . CAD Father   . Prostate cancer Father   . CAD Sister     Social History Social History   Tobacco Use  . Smoking status: Never Smoker  . Smokeless tobacco: Never Used  Substance Use Topics  . Alcohol use: No  . Drug use: No     Allergies   Patient has no known allergies.   Review of Systems Review of Systems  Constitutional: Negative.   HENT: Negative.   Musculoskeletal: Positive for arthralgias. Negative for neck pain and neck stiffness.  Skin: Positive for wound. Negative for color change and pallor.     Physical Exam Triage Vital Signs ED Triage Vitals  Enc Vitals Group     BP 08/28/20 1625 (!) 166/101     Pulse Rate 08/28/20 1625 (!) 57     Resp 08/28/20 1625 18     Temp 08/28/20 1625 98.5 F (36.9 C)     Temp src --      SpO2 08/28/20 1625 94 %     Weight --      Height --      Head Circumference --      Peak Flow --      Pain Score 08/28/20 1624 6     Pain Loc --  Pain Edu? --      Excl. in GC? --    No data found.  Updated Vital Signs BP (!) 166/101   Pulse (!) 57   Temp 98.5 F (36.9 C)   Resp 18   SpO2 94%   Visual Acuity Right Eye Distance:   Left Eye Distance:   Bilateral Distance:    Right Eye Near:   Left Eye Near:    Bilateral Near:     Physical Exam Vitals and nursing note reviewed.  Constitutional:      General: He is not in acute distress.    Appearance: He is not ill-appearing.  Cardiovascular:     Rate and Rhythm: Normal rate and regular rhythm.  Pulmonary:     Effort: Pulmonary effort is normal.     Breath sounds: Normal breath sounds.  Musculoskeletal:     Comments: Left index finger is with swelling of the proximal left index finger.  Limited range of motion..  Bleeding is controlled.  Patient has a puncture wound on the plantar surface of the left index finger just proximal to proximal interphalangeal joint .  Skin:    Capillary Refill: Capillary refill takes  less than 2 seconds.  Neurological:     Mental Status: He is alert.      UC Treatments / Results  Labs (all labs ordered are listed, but only abnormal results are displayed) Labs Reviewed - No data to display  EKG   Radiology No results found.  Procedures Procedures (including critical care time)  Medications Ordered in UC Medications  Tdap (BOOSTRIX) injection 0.5 mL (0.5 mLs Intramuscular Given 08/28/20 1729)    Initial Impression / Assessment and Plan / UC Course  I have reviewed the triage vital signs and the nursing notes.  Pertinent labs & imaging results that were available during my care of the patient were reviewed by me and considered in my medical decision making (see chart for details).     1.  Puncture wound left index finger: Tdap updated Ibuprofen 600 mg every 6 hours as needed for pain Bacitracin ointment. Gentle range of motion exercises If patient notices worsening pain, swelling, increased discharge, discoloration of the finger or redness of the finger-patient is advised to return to the urgent care to be reevaluated.  No indication for x-rays at this time. Final Clinical Impressions(s) / UC Diagnoses   Final diagnoses:  Puncture wound of finger, initial encounter     Discharge Instructions     Daily wound dressing changes with topical antibiotic Gentle range of motion exercises If you notice increased discharge, redness, increased swelling or pain please return to the urgent care to be reevaluated.   ED Prescriptions    Medication Sig Dispense Auth. Provider   ibuprofen (ADVIL) 600 MG tablet Take 1 tablet (600 mg total) by mouth every 6 (six) hours as needed. 30 tablet Elyza Whitt, Britta Mccreedy, MD   bacitracin ointment Apply 1 application topically 2 (two) times daily. 120 g Laith Antonelli, Britta Mccreedy, MD     PDMP not reviewed this encounter.   Merrilee Jansky, MD 08/31/20 [phone]

## 2021-05-26 LAB — EXTERNAL GENERIC LAB PROCEDURE

## 2021-05-26 LAB — COLOGUARD

## 2021-07-09 IMAGING — DX DG HAND COMPLETE 3+V*L*
3 series · 3 of 3 positions shown · non-contrast
Comparison: None.

CLINICAL DATA: Drill bit to 2nd digit on left hand

EXAM:
LEFT HAND - COMPLETE 3+ VIEW

[hand pa]
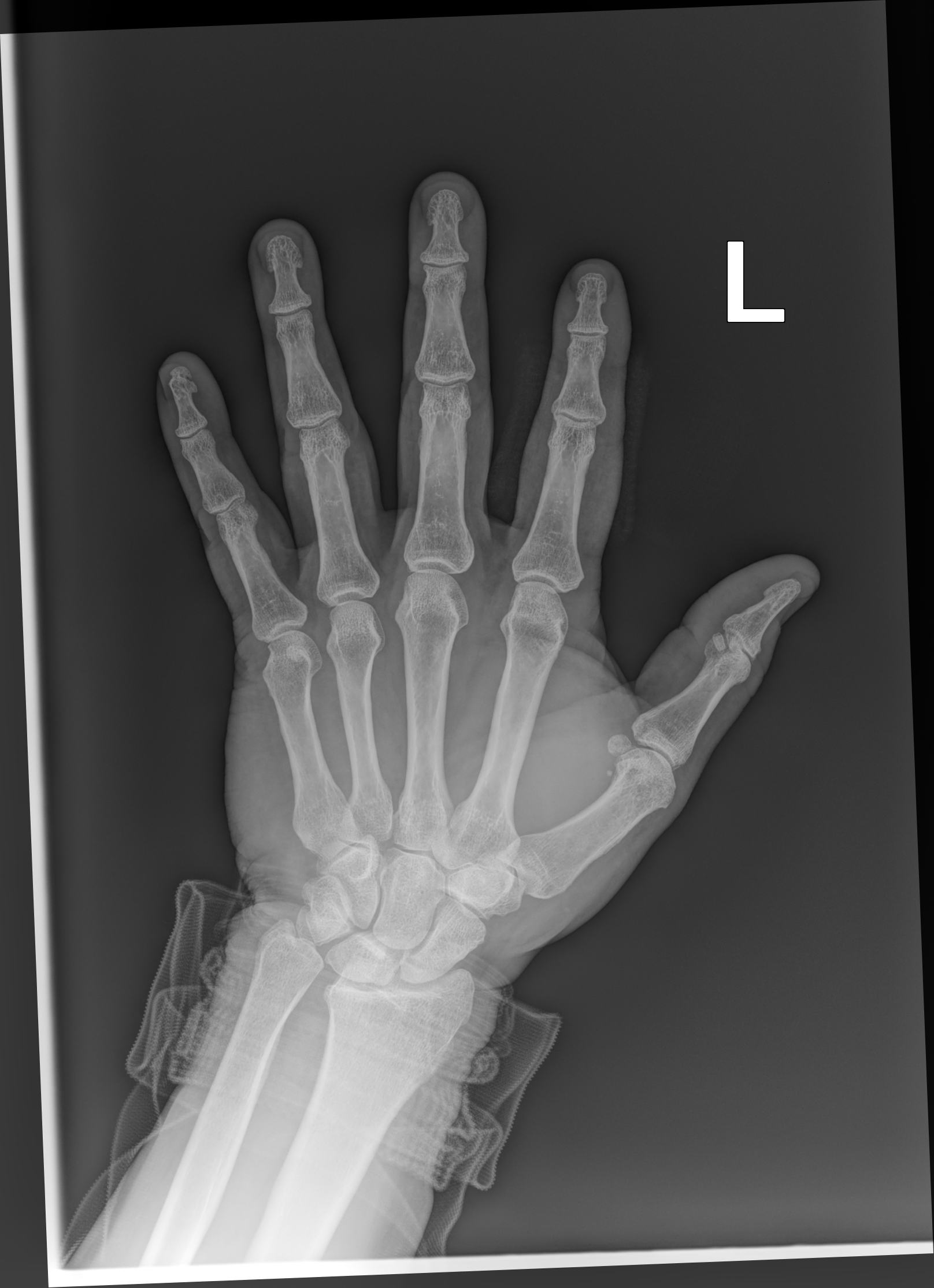

[hand mlo]
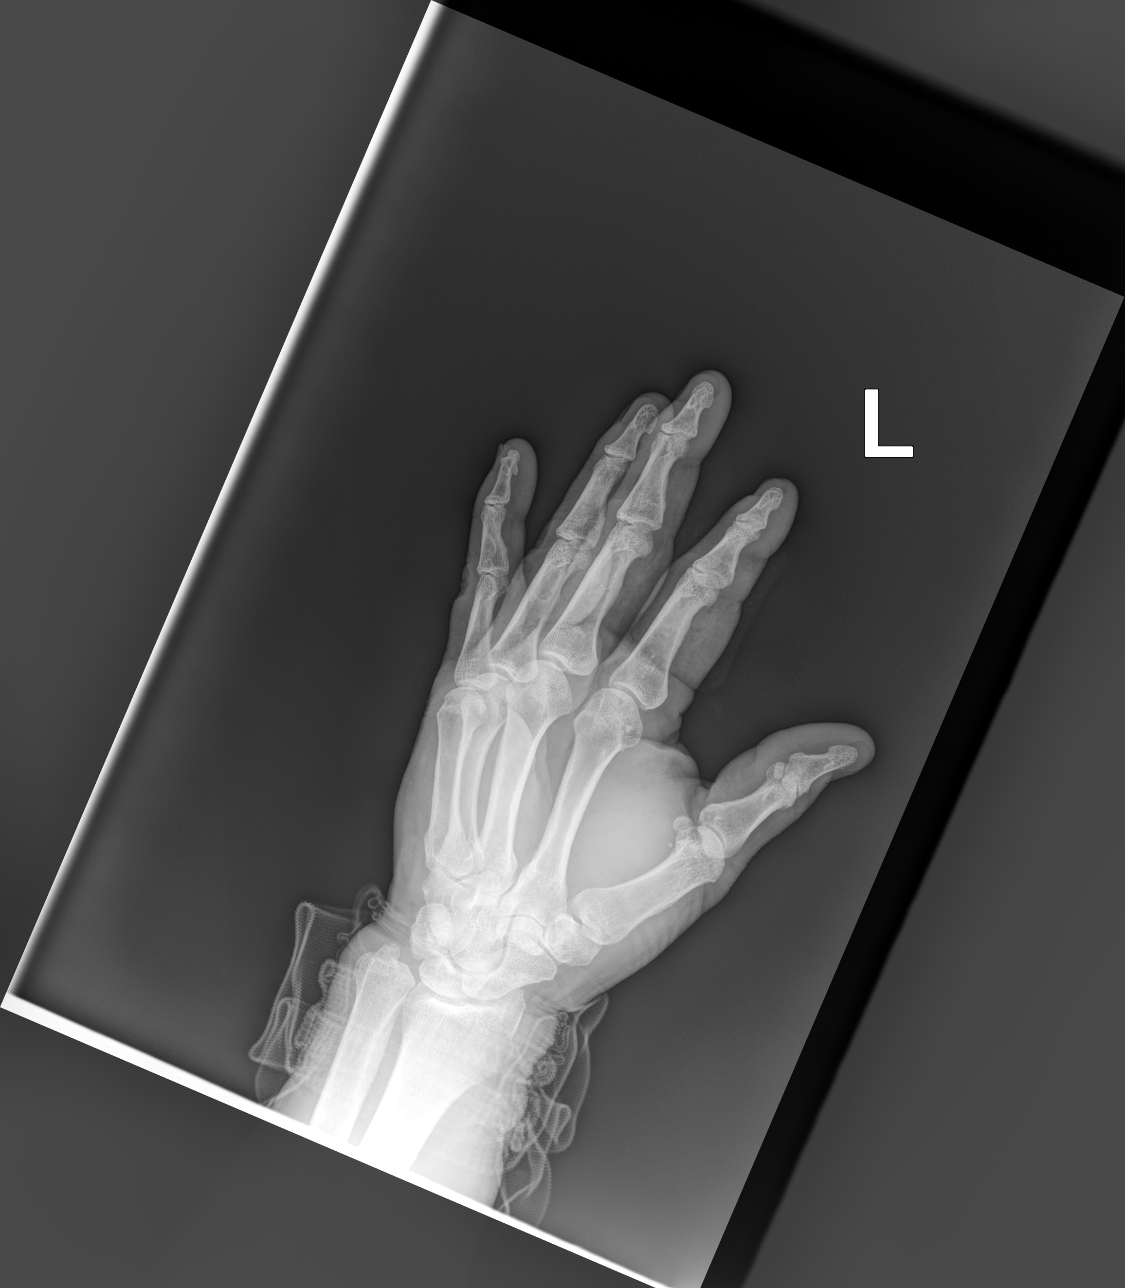

[hand lat]
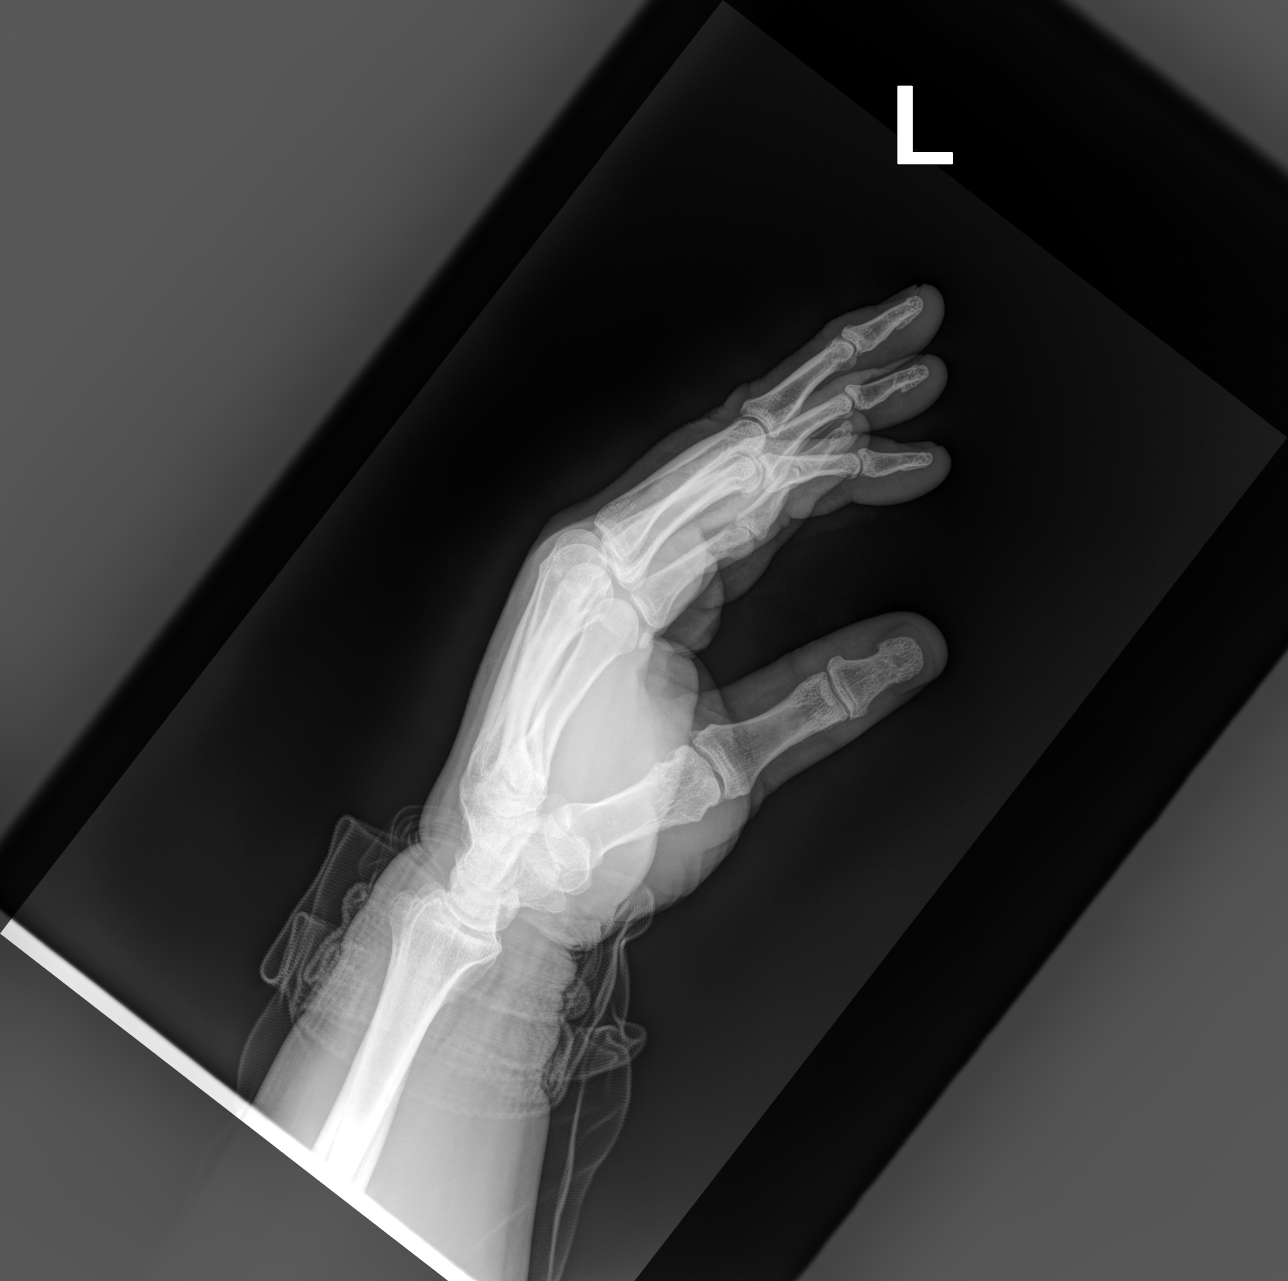

[3 of 3 positions shown; findings below may reference images not displayed]

FINDINGS: There is no evidence of fracture or dislocation. There is no
evidence of erosive arthropathy or other focal bone abnormality. No
radiopaque foreign body identified.
IMPRESSION: No acute osseous abnormality or radiopaque foreign body in the left
hand.

## 2021-09-07 ENCOUNTER — Other Ambulatory Visit: Payer: Self-pay

## 2021-09-07 ENCOUNTER — Emergency Department (HOSPITAL_COMMUNITY)
Admission: EM | Admit: 2021-09-07 | Discharge: 2021-09-07 | Disposition: A | Discharge status: Home / Self Care | Payer: BC Managed Care – PPO | Attending: Emergency Medicine | Admitting: Emergency Medicine

## 2021-09-07 ENCOUNTER — Encounter (HOSPITAL_COMMUNITY): Payer: Self-pay

## 2021-09-07 DIAGNOSIS — U071 COVID-19: Secondary | ICD-10-CM

## 2021-09-07 DIAGNOSIS — R112 Nausea with vomiting, unspecified: Secondary | ICD-10-CM | POA: Diagnosis not present

## 2021-09-07 DIAGNOSIS — Z79899 Other long term (current) drug therapy: Secondary | ICD-10-CM | POA: Diagnosis not present

## 2021-09-07 DIAGNOSIS — G43909 Migraine, unspecified, not intractable, without status migrainosus: Secondary | ICD-10-CM | POA: Insufficient documentation

## 2021-09-07 DIAGNOSIS — R519 Headache, unspecified: Secondary | ICD-10-CM | POA: Diagnosis present

## 2021-09-07 DIAGNOSIS — I10 Essential (primary) hypertension: Secondary | ICD-10-CM

## 2021-09-07 MED ORDER — PROCHLORPERAZINE EDISYLATE 10 MG/2ML IJ SOLN
10.0000 mg | Freq: Once | INTRAMUSCULAR | Status: AC
  Administered 2021-09-07: 22:00:00 10 mg via INTRAVENOUS
  Filled 2021-09-07: qty 2

## 2021-09-07 MED ORDER — DIPHENHYDRAMINE HCL 50 MG/ML IJ SOLN
12.5000 mg | Freq: Once | INTRAMUSCULAR | Status: AC
  Administered 2021-09-07: 22:00:00 12.5 mg via INTRAVENOUS
  Filled 2021-09-07: qty 1

## 2021-09-07 MED ORDER — MORPHINE SULFATE (PF) 4 MG/ML IV SOLN
4.0000 mg | Freq: Once | INTRAVENOUS | Status: AC
  Administered 2021-09-07: 22:00:00 4 mg via INTRAVENOUS
  Filled 2021-09-07: qty 1

## 2021-09-07 NOTE — ED Triage Notes (Signed)
Pt arrived via POV c/o left side migraine worsening for a few days. Pt Dx with Covid yesterday at PCP office and prescribed Augmentin. Pt took 400 mg Ibuprofen at 1200 and 650mg  Tylenol at 1630 today without relief. Pt endorses intermittent nausea without emesis.

## 2021-09-07 NOTE — ED Provider Notes (Signed)
[facility] EMERGENCY DEPARTMENT Provider Note   CSN: [id number] Arrival date & time: 09/07/21  2032     History Chief Complaint  Patient presents with   Migraine    Jeffery Golden is a 55 y.o. male.   Migraine   Patient presents to the ER with evaluation of persistent headache.  Patient states its been ongoing for at least the last 4 days.  Pain is on the left side of his head around his eye.  The symptoms have been worsening in severity over the last several days.  Patient went to his doctor yesterday and was diagnosed with COVID and also given a prescription for Augmentin.  Patient states he has been taking ibuprofen and Tylenol without particular relief.  He had some nausea and vomiting.  He also noticed his blood pressure has been elevated.  He denies any neck stiffness.  No thunderclap onset.  No focal numbness or weakness.  Past Medical History:  Diagnosis Date   Hypertension    Renal disorder    kidney stone    Patient Active Problem List   Diagnosis Date Noted   Chest pain 11/07/2017    Past Surgical History:  Procedure Laterality Date   NO PAST SURGERIES         Family History  Problem Relation Age of Onset   Hypertension Mother    CAD Father    Prostate cancer Father    CAD Sister     Social History   Tobacco Use   Smoking status: Never   Smokeless tobacco: Never  Vaping Use   Vaping Use: Never used  Substance Use Topics   Alcohol use: No   Drug use: No    Home Medications Prior to Admission medications   Medication Sig Start Date End Date Taking? Authorizing Provider  amoxicillin-clavulanate (AUGMENTIN) 875-125 MG tablet 1 tablet    [provider]  atorvastatin (LIPITOR) 10 MG tablet Take 4 tablets (40 mg total) by mouth daily. 11/08/17 11/08/18  Katha Hamming, MD  bacitracin ointment Apply 1 application topically 2 (two) times daily. 08/28/20   Merrilee Jansky, MD  ibuprofen (ADVIL) 600 MG tablet Take 1 tablet (600 mg  total) by mouth every 6 (six) hours as needed. 08/28/20   Merrilee Jansky, MD  lisinopril-hydrochlorothiazide (PRINZIDE,ZESTORETIC) 20-12.5 MG tablet Take 1 tablet by mouth daily.    [provider]  naproxen (NAPROSYN) 500 MG tablet Take 1 tablet (500 mg total) by mouth 2 (two) times daily. 03/29/19   Vanetta Mulders, MD  traMADol (ULTRAM) 50 MG tablet Take 1 tablet (50 mg total) by mouth every 6 (six) hours as needed. Patient not taking: Reported on 03/29/2019 01/12/18   Rebecka Apley, MD    Allergies    Patient has no known allergies.  Review of Systems   Review of Systems  All other systems reviewed and are negative.  Physical Exam Updated Vital Signs BP (!) 150/94    Pulse 97    Temp 98.1 F (36.7 C)    Resp 19    Ht 1.753 m (5\' 9" )    Wt 110.2 kg    SpO2 100%    BMI 35.88 kg/m   Physical Exam Vitals and nursing note reviewed.  Constitutional:      Appearance: He is well-developed. He is not diaphoretic.  HENT:     Head: Normocephalic and atraumatic.     Right Ear: External ear normal.     Left Ear: External ear  normal.  Eyes:     General: No scleral icterus.       Right eye: No discharge.        Left eye: No discharge.     Conjunctiva/sclera: Conjunctivae normal.  Neck:     Trachea: No tracheal deviation.  Cardiovascular:     Rate and Rhythm: Normal rate and regular rhythm.  Pulmonary:     Effort: Pulmonary effort is normal. No respiratory distress.     Breath sounds: Normal breath sounds. No stridor. No wheezing or rales.  Abdominal:     General: Bowel sounds are normal. There is no distension.     Palpations: Abdomen is soft.     Tenderness: There is no abdominal tenderness. There is no guarding or rebound.  Musculoskeletal:        General: No tenderness or deformity.     Cervical back: Neck supple.  Skin:    General: Skin is warm and dry.     Findings: No rash.  Neurological:     General: No focal deficit present.     Mental Status: He is  alert and oriented to person, place, and time.     Cranial Nerves: No cranial nerve deficit (no facial droop, extraocular movements intact, no slurred speech).     Sensory: No sensory deficit.     Motor: No abnormal muscle tone or seizure activity.     Coordination: Coordination normal.     Comments: 5 out of 5 strength upper extremities and lower extremities, no facial droop, normal sensation throughout  Psychiatric:        Mood and Affect: Mood normal.    ED Results / Procedures / Treatments   Labs (all labs ordered are listed, but only abnormal results are displayed) Labs Reviewed - No data to display  EKG None  Radiology No results found.  Procedures Procedures   Medications Ordered in ED Medications  prochlorperazine (COMPAZINE) injection 10 mg (10 mg Intravenous Given 09/07/21 2156)  diphenhydrAMINE (BENADRYL) injection 12.5 mg (12.5 mg Intravenous Given 09/07/21 2154)  morphine 4 MG/ML injection 4 mg (4 mg Intravenous Given 09/07/21 2201)    ED Course  I have reviewed the triage vital signs and the nursing notes.  Pertinent labs & imaging results that were available during my care of the patient were reviewed by me and considered in my medical decision making (see chart for details).  Clinical Course as of 09/07/21 2311  Wed Sep 07, 2021  2310 Patient is feeling better now after treatment.  Blood pressure has improved to 150/94 [JK]    Clinical Course User Index [JK] Linwood Dibbles, MD   MDM Rules/Calculators/A&P                         Patient presented with complaints of headache in the setting of recent COVID infection.  Patient presented to the ED because of his persistent headache.  No history of migraines.  On exam he is not showing any signs of meningismus.  No focal neurologic deficits.  No thunderclap onset to suggest subarachnoid hemorrhage or intracerebral hemorrhage.  Patient was notably hypertensive but that did improve with pain management.  I do not  think his hypertension is the source of his headache.  I suspect the hypertension is related to the discomfort.  Patient was treated with migraine cocktail and is feeling much better now.  At this point.  Stable for discharge and outpatient management.    Final  Clinical Impression(s) / ED Diagnoses Final diagnoses:  Bad headache  COVID-19 virus infection  Hypertension, unspecified type    Rx / DC Orders ED Discharge Orders     None        Linwood Dibbles, MD 09/07/21 2313

## 2021-09-07 NOTE — Discharge Instructions (Signed)
Continue the medications prescribed by your doctor.  Return as needed for worsening symptoms.

## 2023-08-18 ENCOUNTER — Ambulatory Visit
Admission: EM | Admit: 2023-08-18 | Discharge: 2023-08-18 | Disposition: A | Discharge status: Home / Self Care | Payer: BC Managed Care – PPO | Attending: Family Medicine | Admitting: Family Medicine

## 2023-08-18 ENCOUNTER — Ambulatory Visit: Payer: Self-pay

## 2023-08-18 DIAGNOSIS — J208 Acute bronchitis due to other specified organisms: Secondary | ICD-10-CM

## 2023-08-18 LAB — POC COVID19/FLU A&B COMBO
Covid Antigen, POC: NEGATIVE
Influenza A Antigen, POC: NEGATIVE
Influenza B Antigen, POC: NEGATIVE

## 2023-08-18 MED ORDER — PROMETHAZINE-DM 6.25-15 MG/5ML PO SYRP: 5.0000 mL | ORAL_SOLUTION | Freq: Four times a day (QID) | ORAL | 0 refills | Status: AC | PRN

## 2023-08-18 MED ORDER — ALBUTEROL SULFATE HFA 108 (90 BASE) MCG/ACT IN AERS: 2.0000 | INHALATION_SPRAY | RESPIRATORY_TRACT | 0 refills | Status: AC | PRN

## 2023-08-18 MED ORDER — PREDNISONE 20 MG PO TABS: 40.0000 mg | ORAL_TABLET | Freq: Every day | ORAL | 0 refills | Status: AC

## 2023-08-18 NOTE — Discharge Instructions (Signed)
In addition to the prescribed medications, you may take plain Mucinex, Flonase nasal spray, Coricidin HBP, Tylenol and ibuprofen.  Your COVID and flu testing was negative and your chest x-ray was normal today.  Follow-up for worsening symptoms.

## 2023-08-18 NOTE — ED Triage Notes (Signed)
Pt reports he has a cough, sore throat,nasal congestion and mid chest pain that radiates to the middle of his back x 3 days    Took robutussin,

## 2023-08-22 NOTE — ED Provider Notes (Signed)
[facility]    CSN: [id number] Arrival date & time: 08/18/23  1513      History   Chief Complaint No chief complaint on file.   HPI Jeffery Golden is a 57 y.o. male.   Patient presenting today with 3-day history of cough, sore throat, congestion and now mid chest pain radiating toward the back worse with coughing.  Denies fever, chills, shortness of breath, wheezing, abdominal pain, nausea vomiting or diarrhea.  So far trying Robitussin with minimal relief.  No known history of chronic pulmonary disease.    Past Medical History:  Diagnosis Date   Hypertension    Renal disorder    kidney stone    Patient Active Problem List   Diagnosis Date Noted   Chest pain 11/07/2017    Past Surgical History:  Procedure Laterality Date   NO PAST SURGERIES         Home Medications    Prior to Admission medications   Medication Sig Start Date End Date Taking? Authorizing Provider  albuterol (VENTOLIN HFA) 108 (90 Base) MCG/ACT inhaler Inhale 2 puffs into the lungs every 4 (four) hours as needed. 08/18/23  Yes Particia Nearing, PA-C  predniSONE (DELTASONE) 20 MG tablet Take 2 tablets (40 mg total) by mouth daily with breakfast. 08/18/23  Yes Particia Nearing, PA-C  promethazine-dextromethorphan (PROMETHAZINE-DM) 6.25-15 MG/5ML syrup Take 5 mLs by mouth 4 (four) times daily as needed. 08/18/23  Yes Particia Nearing, PA-C  amoxicillin-clavulanate (AUGMENTIN) 875-125 MG tablet 1 tablet    [provider]  atorvastatin (LIPITOR) 10 MG tablet Take 4 tablets (40 mg total) by mouth daily. 11/08/17 11/08/18  Katha Hamming, MD  bacitracin ointment Apply 1 application topically 2 (two) times daily. 08/28/20   Merrilee Jansky, MD  ibuprofen (ADVIL) 600 MG tablet Take 1 tablet (600 mg total) by mouth every 6 (six) hours as needed. 08/28/20   Merrilee Jansky, MD  lisinopril-hydrochlorothiazide (PRINZIDE,ZESTORETIC) 20-12.5 MG tablet Take 1  tablet by mouth daily.    [provider]  naproxen (NAPROSYN) 500 MG tablet Take 1 tablet (500 mg total) by mouth 2 (two) times daily. 03/29/19   Vanetta Mulders, MD  traMADol (ULTRAM) 50 MG tablet Take 1 tablet (50 mg total) by mouth every 6 (six) hours as needed. Patient not taking: Reported on 03/29/2019 01/12/18   Rebecka Apley, MD    Family History Family History  Problem Relation Age of Onset   Hypertension Mother    CAD Father    Prostate cancer Father    CAD Sister     Social History Social History   Tobacco Use   Smoking status: Never   Smokeless tobacco: Never  Vaping Use   Vaping status: Never Used  Substance Use Topics   Alcohol use: No   Drug use: No     Allergies   Patient has no known allergies.   Review of Systems Review of Systems Per HPI  Physical Exam Triage Vital Signs ED Triage Vitals  Encounter Vitals Group     BP 08/18/23 1530 (!) 179/87     Systolic BP Percentile --      Diastolic BP Percentile --      Pulse Rate 08/18/23 1530 67     Resp 08/18/23 1530 18     Temp 08/18/23 1530 99.7 F (37.6 C)     Temp Source 08/18/23 1530 Oral     SpO2 08/18/23 1530 96 %  Weight --      Height --      Head Circumference --      Peak Flow --      Pain Score 08/18/23 1531 0     Pain Loc --      Pain Education --      Exclude from Growth Chart --    No data found.  Updated Vital Signs BP (!) 179/87 (BP Location: Right Arm)   Pulse 67   Temp 99.7 F (37.6 C) (Oral)   Resp 18   SpO2 96%   Visual Acuity Right Eye Distance:   Left Eye Distance:   Bilateral Distance:    Right Eye Near:   Left Eye Near:    Bilateral Near:     Physical Exam Vitals and nursing note reviewed.  Constitutional:      Appearance: He is well-developed.  HENT:     Head: Atraumatic.     Right Ear: External ear normal.     Left Ear: External ear normal.     Nose: Rhinorrhea present.     Mouth/Throat:     Pharynx: Posterior oropharyngeal  erythema present. No oropharyngeal exudate.  Eyes:     Conjunctiva/sclera: Conjunctivae normal.     Pupils: Pupils are equal, round, and reactive to light.  Cardiovascular:     Rate and Rhythm: Normal rate and regular rhythm.  Pulmonary:     Effort: Pulmonary effort is normal. No respiratory distress.     Breath sounds: No wheezing or rales.  Musculoskeletal:        General: Normal range of motion.     Cervical back: Normal range of motion and neck supple.  Lymphadenopathy:     Cervical: No cervical adenopathy.  Skin:    General: Skin is warm and dry.  Neurological:     Mental Status: He is alert and oriented to person, place, and time.  Psychiatric:        Behavior: Behavior normal.      UC Treatments / Results  Labs (all labs ordered are listed, but only abnormal results are displayed) Labs Reviewed  POC COVID19/FLU A&B COMBO    EKG   Radiology No results found.  Procedures Procedures (including critical care time)  Medications Ordered in UC Medications - No data to display  Initial Impression / Assessment and Plan / UC Course  I have reviewed the triage vital signs and the nursing notes.  Pertinent labs & imaging results that were available during my care of the patient were reviewed by me and considered in my medical decision making (see chart for details).     Hypertensive in triage, otherwise vital signs reassuring.  COVID and flu testing negative today in clinic, chest x-ray negative for pneumonia.  Will treat for viral bronchitis with prednisone, Phenergan DM, albuterol, supportive over-the-counter medications and home care.  Return for worsening symptoms.  Final Clinical Impressions(s) / UC Diagnoses   Final diagnoses:  Viral bronchitis     Discharge Instructions      In addition to the prescribed medications, you may take plain Mucinex, Flonase nasal spray, Coricidin HBP, Tylenol and ibuprofen.  Your COVID and flu testing was negative and your  chest x-ray was normal today.  Follow-up for worsening symptoms.    ED Prescriptions     Medication Sig Dispense Auth. Provider   predniSONE (DELTASONE) 20 MG tablet Take 2 tablets (40 mg total) by mouth daily with breakfast. 10 tablet Particia Nearing, [place]  promethazine-dextromethorphan (PROMETHAZINE-DM) 6.25-15 MG/5ML syrup Take 5 mLs by mouth 4 (four) times daily as needed. 100 mL Particia Nearing, PA-C   albuterol (VENTOLIN HFA) 108 (90 Base) MCG/ACT inhaler Inhale 2 puffs into the lungs every 4 (four) hours as needed. 18 g Particia Nearing, [place]      PDMP not reviewed this encounter.   Roosvelt Maser [place], [place] 08/22/23 [phone]

## 2024-04-08 ENCOUNTER — Encounter: Payer: Self-pay | Admitting: Emergency Medicine

## 2024-04-08 ENCOUNTER — Other Ambulatory Visit: Payer: Self-pay

## 2024-04-08 ENCOUNTER — Ambulatory Visit
Admission: EM | Admit: 2024-04-08 | Discharge: 2024-04-08 | Disposition: A | Discharge status: Home / Self Care | Payer: Self-pay | Attending: Family Medicine | Admitting: Family Medicine

## 2024-04-08 DIAGNOSIS — R030 Elevated blood-pressure reading, without diagnosis of hypertension: Secondary | ICD-10-CM

## 2024-04-08 DIAGNOSIS — S46911A Strain of unspecified muscle, fascia and tendon at shoulder and upper arm level, right arm, initial encounter: Secondary | ICD-10-CM

## 2024-04-08 MED ORDER — DEXAMETHASONE SODIUM PHOSPHATE 10 MG/ML IJ SOLN
10.0000 mg | Freq: Once | INTRAMUSCULAR | Status: AC
  Administered 2024-04-08: 10 mg via INTRAMUSCULAR

## 2024-04-08 MED ORDER — TIZANIDINE HCL 4 MG PO CAPS: 4.0000 mg | ORAL_CAPSULE | Freq: Three times a day (TID) | ORAL | 0 refills | Status: AC | PRN

## 2024-04-08 NOTE — Discharge Instructions (Signed)
 We have given you a steroid shot today to help with pain and inflammation and I have prescribed a muscle relaxer to the pharmacy for as needed use.  You may continue using topical rubs, heat, massage, stretches.  Follow-up with orthopedics if not resolving

## 2024-04-08 NOTE — ED Triage Notes (Signed)
 Pt reports right shoulder pain x1 month. Pt reports intermittent tingling sensation in right arm. Denies any known injury.

## 2024-04-11 NOTE — ED Provider Notes (Signed)
 [facility]    CSN: [id number] Arrival date & time: 04/08/24  0803      History   Chief Complaint Chief Complaint  Patient presents with   Shoulder Pain    HPI Jeffery Golden is a 58 y.o. male.   Patient presenting today with 1 month history of constant right shoulder pain worse when he is trying to sleep.  Has intermittent tingling sensation down the right arm.  Denies known injury, swelling, discoloration, muscle weakness, neck pain, mental status changes.  So far trying over-the-counter pain relievers with minimal relief.    Past Medical History:  Diagnosis Date   Hypertension    Renal disorder    kidney stone    Patient Active Problem List   Diagnosis Date Noted   Chest pain 11/07/2017    Past Surgical History:  Procedure Laterality Date   NO PAST SURGERIES         Home Medications    Prior to Admission medications   Medication Sig Start Date End Date Taking? Authorizing Provider  tiZANidine  (ZANAFLEX ) 4 MG capsule Take 1 capsule (4 mg total) by mouth 3 (three) times daily as needed for muscle spasms. Do not drink alcohol or drive while taking this medication.  May cause drowsiness. 04/08/24  Yes Stuart Vernell Norris, PA-C  albuterol  (VENTOLIN  HFA) 108 (90 Base) MCG/ACT inhaler Inhale 2 puffs into the lungs every 4 (four) hours as needed. 08/18/23   Stuart Vernell Norris, PA-C  amoxicillin-clavulanate (AUGMENTIN) 875-125 MG tablet 1 tablet    [provider]  atorvastatin  (LIPITOR) 10 MG tablet Take 4 tablets (40 mg total) by mouth daily. 11/08/17 11/08/18  Almeda Bernard, MD  bacitracin  ointment Apply 1 application topically 2 (two) times daily. 08/28/20   Lamptey, Aleene KIDD, MD  ibuprofen  (ADVIL ) 600 MG tablet Take 1 tablet (600 mg total) by mouth every 6 (six) hours as needed. 08/28/20   LampteyAleene KIDD, MD  lisinopril -hydrochlorothiazide  (PRINZIDE ,ZESTORETIC ) 20-12.5 MG tablet Take 1 tablet by mouth daily.    [provider]  naproxen  (NAPROSYN ) 500 MG tablet Take 1 tablet (500 mg total) by mouth 2 (two) times daily. 03/29/19   Zackowski, Scott, MD  predniSONE  (DELTASONE ) 20 MG tablet Take 2 tablets (40 mg total) by mouth daily with breakfast. 08/18/23   Stuart Vernell Norris, PA-C  promethazine -dextromethorphan (PROMETHAZINE -DM) 6.25-15 MG/5ML syrup Take 5 mLs by mouth 4 (four) times daily as needed. 08/18/23   Stuart Vernell Norris, PA-C  traMADol  (ULTRAM ) 50 MG tablet Take 1 tablet (50 mg total) by mouth every 6 (six) hours as needed. Patient not taking: Reported on 03/29/2019 01/12/18   Charlann Isaiah SQUIBB, MD    Family History Family History  Problem Relation Age of Onset   Hypertension Mother    CAD Father    Prostate cancer Father    CAD Sister     Social History Social History   Tobacco Use   Smoking status: Never   Smokeless tobacco: Never  Vaping Use   Vaping status: Never Used  Substance Use Topics   Alcohol use: No   Drug use: No     Allergies   Patient has no known allergies.   Review of Systems Review of Systems Per HPI  Physical Exam Triage Vital Signs ED Triage Vitals  Encounter Vitals Group     BP 04/08/24 0825 (!) 151/92     Girls Systolic BP Percentile --      Girls Diastolic BP Percentile --  Boys Systolic BP Percentile --      Boys Diastolic BP Percentile --      Pulse Rate 04/08/24 0825 (!) 50     Resp 04/08/24 0825 20     Temp 04/08/24 0825 98.1 F (36.7 C)     Temp Source 04/08/24 0825 Oral     SpO2 04/08/24 0825 96 %     Weight --      Height --      Head Circumference --      Peak Flow --      Pain Score 04/08/24 0824 7     Pain Loc --      Pain Education --      Exclude from Growth Chart --    No data found.  Updated Vital Signs BP (!) 151/92 (BP Location: Right Arm)   Pulse (!) 50   Temp 98.1 F (36.7 C) (Oral)   Resp 20   SpO2 96%   Visual Acuity Right Eye Distance:   Left Eye Distance:   Bilateral Distance:     Right Eye Near:   Left Eye Near:    Bilateral Near:     Physical Exam Vitals and nursing note reviewed.  Constitutional:      Appearance: Normal appearance.  HENT:     Head: Atraumatic.  Eyes:     Extraocular Movements: Extraocular movements intact.     Conjunctiva/sclera: Conjunctivae normal.  Cardiovascular:     Rate and Rhythm: Normal rate.  Pulmonary:     Effort: Pulmonary effort is normal.  Musculoskeletal:        General: Tenderness present. No swelling, deformity or signs of injury. Normal range of motion.     Cervical back: Normal range of motion and neck supple.     Comments: Tenderness to palpation to the right shoulder particularly at insertion of the deltoid.  Range of motion of the right shoulder intact, no bony deformity palpable  Skin:    General: Skin is warm and dry.  Neurological:     General: No focal deficit present.     Mental Status: He is oriented to person, place, and time.     Motor: No weakness.     Gait: Gait normal.     Comments: Right upper extremity neurovascularly intact  Psychiatric:        Mood and Affect: Mood normal.        Thought Content: Thought content normal.        Judgment: Judgment normal.      UC Treatments / Results  Labs (all labs ordered are listed, but only abnormal results are displayed) Labs Reviewed - No data to display  EKG   Radiology No results found.  Procedures Procedures (including critical care time)  Medications Ordered in UC Medications  dexamethasone  (DECADRON ) injection 10 mg (10 mg Intramuscular Given 04/08/24 0856)    Initial Impression / Assessment and Plan / UC Course  I have reviewed the triage vital signs and the nursing notes.  Pertinent labs & imaging results that were available during my care of the patient were reviewed by me and considered in my medical decision making (see chart for details).     X-ray imaging deferred with shared decision making as very low suspicion for bony  injury.  Suspect soft tissue strain.  Treat with IM Decadron , Zanaflex , heat, massage, supportive over-the-counter medications at home care.  Blood pressure was also elevated today, continue to monitor at home and DASH diet, lifestyle  modifications.  Follow-up with PCP if not resolving.  Return for worsening symptoms.  Final Clinical Impressions(s) / UC Diagnoses   Final diagnoses:  Strain of right shoulder, initial encounter  Elevated blood pressure reading     Discharge Instructions      We have given you a steroid shot today to help with pain and inflammation and I have prescribed a muscle relaxer to the pharmacy for as needed use.  You may continue using topical rubs, heat, massage, stretches.  Follow-up with orthopedics if not resolving    ED Prescriptions     Medication Sig Dispense Auth. Provider   tiZANidine  (ZANAFLEX ) 4 MG capsule Take 1 capsule (4 mg total) by mouth 3 (three) times daily as needed for muscle spasms. Do not drink alcohol or drive while taking this medication.  May cause drowsiness. 15 capsule Stuart Vernell Norris, [place]      PDMP not reviewed this encounter.   Stuart Vernell Norris, [place] 04/11/24 1328
